# Patient Record
Sex: Female | Born: 1935 | Hispanic: No | Smoking: Never smoker
Health system: Southern US, Community
[De-identification: ages and names within clinical notes are randomized; demographics above are authoritative.]

## PROBLEM LIST (undated history)

## (undated) DIAGNOSIS — K469 Unspecified abdominal hernia without obstruction or gangrene: Secondary | ICD-10-CM

## (undated) DIAGNOSIS — C210 Malignant neoplasm of anus, unspecified: Secondary | ICD-10-CM

## (undated) DIAGNOSIS — I10 Essential (primary) hypertension: Secondary | ICD-10-CM

## (undated) DIAGNOSIS — K5792 Diverticulitis of intestine, part unspecified, without perforation or abscess without bleeding: Secondary | ICD-10-CM

## (undated) DIAGNOSIS — J45909 Unspecified asthma, uncomplicated: Secondary | ICD-10-CM

## (undated) HISTORY — PX: FRACTURE SURGERY: SHX138

## (undated) HISTORY — PX: ABDOMINAL HYSTERECTOMY: SHX81

## (undated) HISTORY — PX: HERNIA REPAIR: SHX51

---

## 2011-06-26 ENCOUNTER — Emergency Department (HOSPITAL_BASED_OUTPATIENT_CLINIC_OR_DEPARTMENT_OTHER)
Admission: EM | Admit: 2011-06-26 | Discharge: 2011-06-26 | Disposition: A | Discharge status: Home / Self Care | Payer: Medicare Other | Attending: Emergency Medicine | Admitting: Emergency Medicine

## 2011-06-26 ENCOUNTER — Emergency Department (HOSPITAL_BASED_OUTPATIENT_CLINIC_OR_DEPARTMENT_OTHER): Payer: Medicare Other

## 2011-06-26 ENCOUNTER — Encounter (HOSPITAL_BASED_OUTPATIENT_CLINIC_OR_DEPARTMENT_OTHER): Payer: Self-pay

## 2011-06-26 DIAGNOSIS — K529 Noninfective gastroenteritis and colitis, unspecified: Secondary | ICD-10-CM

## 2011-06-26 DIAGNOSIS — I10 Essential (primary) hypertension: Secondary | ICD-10-CM | POA: Insufficient documentation

## 2011-06-26 DIAGNOSIS — R10819 Abdominal tenderness, unspecified site: Secondary | ICD-10-CM | POA: Insufficient documentation

## 2011-06-26 DIAGNOSIS — R109 Unspecified abdominal pain: Secondary | ICD-10-CM | POA: Insufficient documentation

## 2011-06-26 DIAGNOSIS — K5289 Other specified noninfective gastroenteritis and colitis: Secondary | ICD-10-CM | POA: Insufficient documentation

## 2011-06-26 HISTORY — DX: Unspecified asthma, uncomplicated: J45.909

## 2011-06-26 HISTORY — DX: Malignant neoplasm of anus, unspecified: C21.0

## 2011-06-26 HISTORY — DX: Diverticulitis of intestine, part unspecified, without perforation or abscess without bleeding: K57.92

## 2011-06-26 HISTORY — DX: Essential (primary) hypertension: I10

## 2011-06-26 HISTORY — DX: Unspecified abdominal hernia without obstruction or gangrene: K46.9

## 2011-06-26 LAB — DIFFERENTIAL
Basophils Absolute: 0 10*3/uL (ref 0.0–0.1)
Basophils Relative: 0 % (ref 0–1)
Eosinophils Absolute: 0 10*3/uL (ref 0.0–0.7)
Monocytes Absolute: 0.7 10*3/uL (ref 0.1–1.0)
Monocytes Relative: 9 % (ref 3–12)
Neutrophils Relative %: 75 % (ref 43–77)

## 2011-06-26 LAB — URINALYSIS, ROUTINE W REFLEX MICROSCOPIC
Bilirubin Urine: NEGATIVE
Nitrite: NEGATIVE
Specific Gravity, Urine: 1.011 (ref 1.005–1.030)
Urobilinogen, UA: 0.2 mg/dL (ref 0.0–1.0)
pH: 7.5 (ref 5.0–8.0)

## 2011-06-26 LAB — LIPASE, BLOOD: Lipase: 24 U/L (ref 11–59)

## 2011-06-26 LAB — URINE MICROSCOPIC-ADD ON

## 2011-06-26 LAB — COMPREHENSIVE METABOLIC PANEL
AST: 23 U/L (ref 0–37)
Albumin: 3.3 g/dL — ABNORMAL LOW (ref 3.5–5.2)
BUN: 17 mg/dL (ref 6–23)
Calcium: 9.9 mg/dL (ref 8.4–10.5)
Creatinine, Ser: 0.7 mg/dL (ref 0.50–1.10)
Total Protein: 6.8 g/dL (ref 6.0–8.3)

## 2011-06-26 LAB — CBC
HCT: 35.3 % — ABNORMAL LOW (ref 36.0–46.0)
Hemoglobin: 12.1 g/dL (ref 12.0–15.0)
MCH: 33.1 pg (ref 26.0–34.0)
MCHC: 34.3 g/dL (ref 30.0–36.0)
RDW: 12.6 % (ref 11.5–15.5)

## 2011-06-26 MED ORDER — HYDROCODONE-ACETAMINOPHEN 5-500 MG PO TABS: 1.0000 | ORAL_TABLET | Freq: Four times a day (QID) | ORAL | Status: DC | PRN

## 2011-06-26 MED ORDER — CIPROFLOXACIN HCL 500 MG PO TABS: 500.0000 mg | ORAL_TABLET | Freq: Two times a day (BID) | ORAL | Status: AC

## 2011-06-26 MED ORDER — ONDANSETRON HCL 4 MG PO TABS: 4.0000 mg | ORAL_TABLET | Freq: Four times a day (QID) | ORAL | Status: DC

## 2011-06-26 MED ORDER — CIPROFLOXACIN HCL 500 MG PO TABS: 500.0000 mg | ORAL_TABLET | Freq: Two times a day (BID) | ORAL | Status: DC

## 2011-06-26 MED ORDER — SODIUM CHLORIDE 0.9 % IV BOLUS (SEPSIS)
500.0000 mL | Freq: Once | INTRAVENOUS | Status: AC
  Administered 2011-06-26: 500 mL via INTRAVENOUS

## 2011-06-26 MED ORDER — IOHEXOL 300 MG/ML  SOLN
18.0000 mL | INTRAMUSCULAR | Status: AC
  Administered 2011-06-26: 18 mL via ORAL

## 2011-06-26 MED ORDER — ONDANSETRON HCL 4 MG/2ML IJ SOLN
4.0000 mg | Freq: Once | INTRAMUSCULAR | Status: AC
  Administered 2011-06-26: 4 mg via INTRAVENOUS
  Filled 2011-06-26: qty 2

## 2011-06-26 MED ORDER — METRONIDAZOLE 500 MG PO TABS
500.0000 mg | ORAL_TABLET | Freq: Once | ORAL | Status: AC
  Administered 2011-06-26: 500 mg via ORAL
  Filled 2011-06-26: qty 1

## 2011-06-26 MED ORDER — MORPHINE SULFATE 2 MG/ML IJ SOLN
2.0000 mg | Freq: Once | INTRAMUSCULAR | Status: AC
  Administered 2011-06-26: 2 mg via INTRAVENOUS
  Filled 2011-06-26: qty 1

## 2011-06-26 MED ORDER — HYDROCODONE-ACETAMINOPHEN 5-500 MG PO TABS: 1.0000 | ORAL_TABLET | Freq: Four times a day (QID) | ORAL | Status: AC | PRN

## 2011-06-26 MED ORDER — CIPROFLOXACIN HCL 500 MG PO TABS
500.0000 mg | ORAL_TABLET | Freq: Once | ORAL | Status: AC
Start: 2011-06-26 — End: 2011-06-26
  Administered 2011-06-26: 500 mg via ORAL
  Filled 2011-06-26: qty 1

## 2011-06-26 MED ORDER — ONDANSETRON HCL 4 MG PO TABS: 4.0000 mg | ORAL_TABLET | Freq: Four times a day (QID) | ORAL | Status: AC

## 2011-06-26 MED ORDER — IOHEXOL 300 MG/ML  SOLN
100.0000 mL | Freq: Once | INTRAMUSCULAR | Status: AC | PRN
  Administered 2011-06-26: 100 mL via INTRAVENOUS

## 2011-06-26 MED ORDER — METRONIDAZOLE 500 MG PO TABS: 500.0000 mg | ORAL_TABLET | Freq: Two times a day (BID) | ORAL | Status: DC

## 2011-06-26 MED ORDER — METRONIDAZOLE 500 MG PO TABS: 500.0000 mg | ORAL_TABLET | Freq: Two times a day (BID) | ORAL | Status: AC

## 2011-06-26 NOTE — ED Notes (Signed)
Pt has abdominal pain, vomiting and diarrhea sin 0200am.

## 2011-06-26 NOTE — ED Provider Notes (Signed)
History   This chart was scribed for Stacey Bucco, MD by [name]. The patient was seen in room MH02/MH02 and the patient's care was started at 4:07 PM     CSN: [id number]  Arrival date & time 06/26/11  1512   First MD Initiated Contact with Patient 06/26/11 1604      Chief Complaint  Patient presents with  . Abdominal Pain  . Emesis  . Diarrhea    (Consider location/radiation/quality/duration/timing/severity/associated sxs/prior treatment) HPI  Stacey Erickson is a 78 y.o. female who presents to the Emergency Department complaining of moderate, episodic abdominal pain located at the left mid abdomen onset today with associated symptoms of vomiting, diarrhea. The pt relative states "the pain comes and goes." The pt relative informs the EDP that the vomit has been "yellow and bitter." Pt relative states "we all ate soup last night." Pt has a hx of hernia repair, abdominal hysterectomy.  Has hx of diverticulitis, says that this feels similar.  Has taken pepto bismol without relief.  Symptoms started last night  Pt denies removal of gallbladder and appendix, fever, hematochezia, melena, rash, difficulty urinating,   Pt visits with Physicians in [place], [place].    Past Medical History  Diagnosis Date  . Asthma   . Hypertension   . Anal cancer   . Diverticulitis   . Hernia     Past Surgical History  Procedure Date  . Hernia repair   . Fracture surgery   . Abdominal hysterectomy       History  Substance Use Topics  . Smoking status: Never Smoker   . Smokeless tobacco: Never Used  . Alcohol Use: No    OB History    Grav Para Term Preterm Abortions TAB SAB Ect Mult Living                  Review of Systems  Constitutional: Negative for fever, chills, diaphoresis and fatigue.  HENT: Negative for congestion, rhinorrhea and sneezing.   Eyes: Negative.   Respiratory: Negative for cough, chest tightness and shortness of breath.   Cardiovascular: Negative for chest pain  and leg swelling.  Gastrointestinal: Positive for nausea, vomiting, abdominal pain and diarrhea. Negative for blood in stool.  Genitourinary: Negative for frequency, hematuria, flank pain and difficulty urinating.  Musculoskeletal: Negative for back pain and arthralgias.  Skin: Negative for rash.  Neurological: Negative for dizziness, speech difficulty, weakness, numbness and headaches.    Allergies  Review of patient's allergies indicates no known allergies.  Home Medications   Current Outpatient Rx  Name Route Sig Dispense Refill  . AMLODIPINE BESYLATE 10 MG PO TABS Oral Take 10 mg by mouth daily.    Marland Kitchen BIOTIN 10 MG PO CAPS Oral Take 1 tablet by mouth daily.     Marland Kitchen BISMUTH SUBSALICYLATE 262 MG PO CHEW Oral Chew 262 mg by mouth as needed. Patient used this medication for stomach pain.    Marland Kitchen CALCIUM CARBONATE 1250 MG PO CHEW Oral Chew 1 tablet by mouth daily. Patient used this medication for stomach pain as well.    Marland Kitchen VITAMIN D 1000 UNITS PO TABS Oral Take 1,000 Units by mouth daily.    . MULTIVITAMINS PO CAPS Oral Take 1 capsule by mouth daily.    Marland Kitchen OMEPRAZOLE 20 MG PO CPDR Oral Take 20 mg by mouth daily. Patient is using this medication for acid reflux.    Marland Kitchen PRAVASTATIN SODIUM 10 MG PO TABS Oral Take 10 mg by mouth daily.    Marland Kitchen  CIPROFLOXACIN HCL 500 MG PO TABS Oral Take 1 tablet (500 mg total) by mouth every 12 (twelve) hours. 20 tablet 0  . METRONIDAZOLE 500 MG PO TABS Oral Take 1 tablet (500 mg total) by mouth 2 (two) times daily. 14 tablet 0  . ONDANSETRON HCL 4 MG PO TABS Oral Take 1 tablet (4 mg total) by mouth every 6 (six) hours. 12 tablet 0    BP 139/64  Pulse 85  Temp 98.1 F (36.7 C) (Oral)  Resp 16  Ht 5\' 2"  (1.575 m)  Wt 137 lb (62.143 kg)  BMI 25.06 kg/m2  SpO2 100%  Physical Exam  Nursing note and vitals reviewed. Constitutional: She is oriented to person, place, and time. She appears well-developed and well-nourished.  HENT:  Head: Normocephalic and atraumatic.    Eyes: Pupils are equal, round, and reactive to light.  Neck: Normal range of motion. Neck supple.  Cardiovascular: Normal rate, regular rhythm and normal heart sounds.   Pulmonary/Chest: Effort normal and breath sounds normal. No respiratory distress. She has no wheezes. She has no rales. She exhibits no tenderness.  Abdominal: Soft. Bowel sounds are normal. There is tenderness (moderate, left mid abdomen. ). There is no rebound and no guarding.  Musculoskeletal: Normal range of motion. She exhibits no edema.  Lymphadenopathy:    She has no cervical adenopathy.  Neurological: She is alert and oriented to person, place, and time.  Skin: Skin is warm and dry. No rash noted.  Psychiatric: She has a normal mood and affect.    ED Course  Procedures (including critical care time)  DIAGNOSTIC STUDIES: Oxygen Saturation is 100% on room air, normal by my interpretation.    COORDINATION OF CARE:   4:11PM- EDP at bedside discusses treatment plan concerning IV fluids and nausea management, CT scan, and blood work.   Results for orders placed during the hospital encounter of 06/26/11  URINALYSIS, ROUTINE W REFLEX MICROSCOPIC      Component Value Range   Color, Urine YELLOW  YELLOW   APPearance CLEAR  CLEAR   Specific Gravity, Urine 1.011  1.005 - 1.030   pH 7.5  5.0 - 8.0   Glucose, UA NEGATIVE  NEGATIVE mg/dL   Hgb urine dipstick NEGATIVE  NEGATIVE   Bilirubin Urine NEGATIVE  NEGATIVE   Ketones, ur NEGATIVE  NEGATIVE mg/dL   Protein, ur NEGATIVE  NEGATIVE mg/dL   Urobilinogen, UA 0.2  0.0 - 1.0 mg/dL   Nitrite NEGATIVE  NEGATIVE   Leukocytes, UA TRACE (*) NEGATIVE  URINE MICROSCOPIC-ADD ON      Component Value Range   Squamous Epithelial / LPF RARE  RARE   WBC, UA 0-2  <3 WBC/hpf   Bacteria, UA RARE  RARE  CBC      Component Value Range   WBC 7.4  4.0 - 10.5 K/uL   RBC 3.66 (*) 3.87 - 5.11 MIL/uL   Hemoglobin 12.1  12.0 - 15.0 g/dL   HCT 16.1 (*) 09.6 - 04.5 %   MCV 96.4  78.0  - 100.0 fL   MCH 33.1  26.0 - 34.0 pg   MCHC 34.3  30.0 - 36.0 g/dL   RDW 40.9  81.1 - 91.4 %   Platelets 151  150 - 400 K/uL  DIFFERENTIAL      Component Value Range   Neutrophils Relative 75  43 - 77 %   Neutro Abs 5.6  1.7 - 7.7 K/uL   Lymphocytes Relative 15  12 - 46 %  Lymphs Abs 1.1  0.7 - 4.0 K/uL   Monocytes Relative 9  3 - 12 %   Monocytes Absolute 0.7  0.1 - 1.0 K/uL   Eosinophils Relative 0  0 - 5 %   Eosinophils Absolute 0.0  0.0 - 0.7 K/uL   Basophils Relative 0  0 - 1 %   Basophils Absolute 0.0  0.0 - 0.1 K/uL  COMPREHENSIVE METABOLIC PANEL      Component Value Range   Sodium 140  135 - 145 mEq/L   Potassium 3.6  3.5 - 5.1 mEq/L   Chloride 104  96 - 112 mEq/L   CO2 28  19 - 32 mEq/L   Glucose, Bld 112 (*) 70 - 99 mg/dL   BUN 17  6 - 23 mg/dL   Creatinine, Ser 6.96  0.50 - 1.10 mg/dL   Calcium 9.9  8.4 - 29.5 mg/dL   Total Protein 6.8  6.0 - 8.3 g/dL   Albumin 3.3 (*) 3.5 - 5.2 g/dL   AST 23  0 - 37 U/L   ALT 13  0 - 35 U/L   Alkaline Phosphatase 100  39 - 117 U/L   Total Bilirubin 0.3  0.3 - 1.2 mg/dL   GFR calc non Af Amer 81 (*) >90 mL/min   GFR calc Af Amer >90  >90 mL/min  LIPASE, BLOOD      Component Value Range   Lipase 24  11 - 59 U/L   Ct Abdomen Pelvis W Contrast  06/26/2011  *RADIOLOGY REPORT*  Clinical Data: Left-sided abdominal pain  CT ABDOMEN AND PELVIS WITH CONTRAST  Technique:  Multidetector CT imaging of the abdomen and pelvis was performed following the standard protocol during bolus administration of intravenous contrast.  Contrast: 18mL OMNIPAQUE IOHEXOL 300 MG/ML  SOLN, OMNIPAQUE IOHEXOL 300 MG/ML  SOLN  Comparison: None.  Findings: A catheter tip is noted at the cavoatrial junction. Coronary artery and aortic valve calcification.  Mild linear opacities at the lung bases and a calcified right lower lobe granuloma.  Unremarkable liver, biliary system, spleen, adrenal glands.  There is heterogeneous attenuation about the head of the  pancreas, favored to reflect interdigitating fat.  No main duct dilatation.  2.8 cm right renal cyst.  Several right renal hypodensities are too small to further characterize.  No hydronephrosis or hydroureter.  Decompressed colon with colonic diverticulosis noted.  Ingested contrast is present within the cecum and distal small bowel. Mild small bowel dilatation with air-fluid level of a 3.3 cm. Transition point at a loop of proximal ileum where there is marked circumferential thickening and mucosal hyperenhancement.  There is mild associated vascular engorgement and interloop mesenteric fluid.  No free intraperitoneal air.  No pneumatosis.  No free intraperitoneal air.  No lymphadenopathy. There is scattered atherosclerotic calcification of the aorta and its branches. No aneurysmal dilatation.  Thin-walled bladder.  Absent uterus.  No adnexal mass.  Status post anterior abdominal wall mesh repair.  Osteopenia and multilevel degenerative changes.  No acute osseous finding.  IMPRESSION: Thickened segment of the proximal jejunal with associated perienteric fat stranding and fluid.  This is in keeping with a nonspecific enteritis with ischemic, infectious, and inflammatory considerations.  Proximal small bowel dilatation suggests delayed transit through this segment however contrast is noted within more distal small bowel, therefore no complete obstruction at this time.  There is heterogeneous attenuation about the head of the pancreas, favored to reflect interdigitating fat. Correlation with prior outside imaging recommended to  document stability.  If not available, consider a follow-up pancreas protocol CT or MRI.  Original Report Authenticated By: Waneta Martins, M.D.      Ct Abdomen Pelvis W Contrast  06/26/2011  *RADIOLOGY REPORT*  Clinical Data: Left-sided abdominal pain  CT ABDOMEN AND PELVIS WITH CONTRAST  Technique:  Multidetector CT imaging of the abdomen and pelvis was performed following the  standard protocol during bolus administration of intravenous contrast.  Contrast: 18mL OMNIPAQUE IOHEXOL 300 MG/ML  SOLN, OMNIPAQUE IOHEXOL 300 MG/ML  SOLN  Comparison: None.  Findings: A catheter tip is noted at the cavoatrial junction. Coronary artery and aortic valve calcification.  Mild linear opacities at the lung bases and a calcified right lower lobe granuloma.  Unremarkable liver, biliary system, spleen, adrenal glands.  There is heterogeneous attenuation about the head of the pancreas, favored to reflect interdigitating fat.  No main duct dilatation.  2.8 cm right renal cyst.  Several right renal hypodensities are too small to further characterize.  No hydronephrosis or hydroureter.  Decompressed colon with colonic diverticulosis noted.  Ingested contrast is present within the cecum and distal small bowel. Mild small bowel dilatation with air-fluid level of a 3.3 cm. Transition point at a loop of proximal ileum where there is marked circumferential thickening and mucosal hyperenhancement.  There is mild associated vascular engorgement and interloop mesenteric fluid.  No free intraperitoneal air.  No pneumatosis.  No free intraperitoneal air.  No lymphadenopathy. There is scattered atherosclerotic calcification of the aorta and its branches. No aneurysmal dilatation.  Thin-walled bladder.  Absent uterus.  No adnexal mass.  Status post anterior abdominal wall mesh repair.  Osteopenia and multilevel degenerative changes.  No acute osseous finding.  IMPRESSION: Thickened segment of the proximal jejunal with associated perienteric fat stranding and fluid.  This is in keeping with a nonspecific enteritis with ischemic, infectious, and inflammatory considerations.  Proximal small bowel dilatation suggests delayed transit through this segment however contrast is noted within more distal small bowel, therefore no complete obstruction at this time.  There is heterogeneous attenuation about the head of the  pancreas, favored to reflect interdigitating fat. Correlation with prior outside imaging recommended to document stability.  If not available, consider a follow-up pancreas protocol CT or MRI.  Original Report Authenticated By: Waneta Martins, M.D.     1. Enteritis       MDM  Pt with enteritis, has hx of similar symptoms with diverticulitis.  Will tx for this.  Pt's exam not consistent with ischemic colitis.  Only with minimal pain on abd exam.  Feeling better.  Advised pt and daughter that will need f/u with her PMD in [place] regarding the pancreas abnormality on CT      I personally performed the services described in this documentation, which was scribed in my presence.  The recorded information has been reviewed and considered.    Stacey Bucco, MD 06/26/11 [phone]

## 2011-06-26 NOTE — Discharge Instructions (Signed)
Colitis Colitis is inflammation of the colon. Colitis can be a short-term or long-standing (chronic) illness. Crohn's disease and ulcerative colitis are 2 types of colitis which are chronic. They usually require lifelong treatment. CAUSES  There are many different causes of colitis, including:  Viruses.   Germs (bacteria).   Medicine reactions.  SYMPTOMS   Diarrhea.   Intestinal bleeding.   Pain.   Fever.   Throwing up (vomiting).   Tiredness (fatigue).   Weight loss.   Bowel blockage.  DIAGNOSIS  The diagnosis of colitis is based on examination and stool or blood tests. X-rays, CT scan, and colonoscopy may also be needed. TREATMENT  Treatment may include:  Fluids given through the vein (intravenously).   Bowel rest (nothing to eat or drink for a period of time).   Medicine for pain and diarrhea.   Medicines (antibiotics) that kill germs.   Cortisone medicines.   Surgery.  HOME CARE INSTRUCTIONS   Get plenty of rest.   Drink enough water and fluids to keep your urine clear or pale yellow.   Eat a well-balanced diet.   Call your caregiver for follow-up as recommended.  SEEK IMMEDIATE MEDICAL CARE IF:   You develop chills.   You have an oral temperature above 102 F (38.9 C), not controlled by medicine.   You have extreme weakness, fainting, or dehydration.   You have repeated vomiting.   You develop severe belly (abdominal) pain or are passing bloody or tarry stools.  MAKE SURE YOU:   Understand these instructions.   Will watch your condition.   Will get help right away if you are not doing well or get worse.  Document Released: 02/03/2004 Document Revised: 12/15/2010 Document Reviewed: 04/30/2009 [employer] Patient Information 2012 [employer]. 

## 2014-04-13 ENCOUNTER — Other Ambulatory Visit: Payer: Self-pay

## 2014-04-13 ENCOUNTER — Encounter (HOSPITAL_BASED_OUTPATIENT_CLINIC_OR_DEPARTMENT_OTHER): Payer: Self-pay

## 2014-04-13 ENCOUNTER — Emergency Department (HOSPITAL_BASED_OUTPATIENT_CLINIC_OR_DEPARTMENT_OTHER)
Admission: EM | Admit: 2014-04-13 | Discharge: 2014-04-13 | Disposition: A | Discharge status: Home / Self Care | Payer: Medicare Other | Attending: Emergency Medicine | Admitting: Emergency Medicine

## 2014-04-13 DIAGNOSIS — N390 Urinary tract infection, site not specified: Secondary | ICD-10-CM | POA: Insufficient documentation

## 2014-04-13 DIAGNOSIS — S161XXA Strain of muscle, fascia and tendon at neck level, initial encounter: Secondary | ICD-10-CM | POA: Diagnosis not present

## 2014-04-13 DIAGNOSIS — X58XXXA Exposure to other specified factors, initial encounter: Secondary | ICD-10-CM | POA: Diagnosis not present

## 2014-04-13 DIAGNOSIS — Z79899 Other long term (current) drug therapy: Secondary | ICD-10-CM | POA: Diagnosis not present

## 2014-04-13 DIAGNOSIS — R630 Anorexia: Secondary | ICD-10-CM | POA: Insufficient documentation

## 2014-04-13 DIAGNOSIS — S4992XA Unspecified injury of left shoulder and upper arm, initial encounter: Secondary | ICD-10-CM | POA: Insufficient documentation

## 2014-04-13 DIAGNOSIS — Y9389 Activity, other specified: Secondary | ICD-10-CM | POA: Insufficient documentation

## 2014-04-13 DIAGNOSIS — J45909 Unspecified asthma, uncomplicated: Secondary | ICD-10-CM | POA: Insufficient documentation

## 2014-04-13 DIAGNOSIS — Z85048 Personal history of other malignant neoplasm of rectum, rectosigmoid junction, and anus: Secondary | ICD-10-CM | POA: Insufficient documentation

## 2014-04-13 DIAGNOSIS — Y9289 Other specified places as the place of occurrence of the external cause: Secondary | ICD-10-CM | POA: Insufficient documentation

## 2014-04-13 DIAGNOSIS — Z8719 Personal history of other diseases of the digestive system: Secondary | ICD-10-CM | POA: Insufficient documentation

## 2014-04-13 DIAGNOSIS — Y998 Other external cause status: Secondary | ICD-10-CM | POA: Insufficient documentation

## 2014-04-13 DIAGNOSIS — I10 Essential (primary) hypertension: Secondary | ICD-10-CM | POA: Diagnosis not present

## 2014-04-13 DIAGNOSIS — R531 Weakness: Secondary | ICD-10-CM | POA: Diagnosis present

## 2014-04-13 LAB — COMPREHENSIVE METABOLIC PANEL
ALT: 32 U/L (ref 0–35)
ANION GAP: 9 (ref 5–15)
AST: 38 U/L — ABNORMAL HIGH (ref 0–37)
Albumin: 3.5 g/dL (ref 3.5–5.2)
Alkaline Phosphatase: 140 U/L — ABNORMAL HIGH (ref 39–117)
BUN: 25 mg/dL — AB (ref 6–23)
CALCIUM: 9.2 mg/dL (ref 8.4–10.5)
CO2: 27 mmol/L (ref 19–32)
CREATININE: 0.88 mg/dL (ref 0.50–1.10)
Chloride: 98 mmol/L (ref 96–112)
GFR calc Af Amer: 70 mL/min — ABNORMAL LOW (ref >90)
GFR, EST NON AFRICAN AMERICAN: 60 mL/min — AB (ref >90)
GLUCOSE: 117 mg/dL — AB (ref 70–99)
Potassium: 3.7 mmol/L (ref 3.5–5.1)
SODIUM: 134 mmol/L — AB (ref 135–145)
Total Bilirubin: 0.2 mg/dL — ABNORMAL LOW (ref 0.3–1.2)
Total Protein: 7.9 g/dL (ref 6.0–8.3)

## 2014-04-13 LAB — CBC WITH DIFFERENTIAL/PLATELET
BASOS ABS: 0 10*3/uL (ref 0.0–0.1)
BASOS PCT: 0 % (ref 0–1)
Eosinophils Absolute: 0 10*3/uL (ref 0.0–0.7)
Eosinophils Relative: 0 % (ref 0–5)
HEMATOCRIT: 38.6 % (ref 36.0–46.0)
HEMOGLOBIN: 12.3 g/dL (ref 12.0–15.0)
LYMPHS PCT: 13 % (ref 12–46)
Lymphs Abs: 0.9 10*3/uL (ref 0.7–4.0)
MCH: 30.4 pg (ref 26.0–34.0)
MCHC: 31.9 g/dL (ref 30.0–36.0)
MCV: 95.3 fL (ref 78.0–100.0)
MONO ABS: 0.9 10*3/uL (ref 0.1–1.0)
Monocytes Relative: 13 % — ABNORMAL HIGH (ref 3–12)
NEUTROS ABS: 5.6 10*3/uL (ref 1.7–7.7)
NEUTROS PCT: 74 % (ref 43–77)
Platelets: 166 10*3/uL (ref 150–400)
RBC: 4.05 MIL/uL (ref 3.87–5.11)
RDW: 13 % (ref 11.5–15.5)
WBC: 7.4 10*3/uL (ref 4.0–10.5)

## 2014-04-13 LAB — URINALYSIS, ROUTINE W REFLEX MICROSCOPIC
BILIRUBIN URINE: NEGATIVE
GLUCOSE, UA: NEGATIVE mg/dL
Ketones, ur: NEGATIVE mg/dL
NITRITE: NEGATIVE
PH: 6.5 (ref 5.0–8.0)
Protein, ur: 30 mg/dL — AB
SPECIFIC GRAVITY, URINE: 1.013 (ref 1.005–1.030)
Urobilinogen, UA: 1 mg/dL (ref 0.0–1.0)

## 2014-04-13 LAB — TROPONIN I: Troponin I: 0.03 ng/mL (ref <0.031)

## 2014-04-13 LAB — URINE MICROSCOPIC-ADD ON

## 2014-04-13 MED ORDER — DEXTROSE 5 % IV SOLN
1.0000 g | Freq: Once | INTRAVENOUS | Status: AC
  Administered 2014-04-13: 1 g via INTRAVENOUS

## 2014-04-13 MED ORDER — CEFTRIAXONE SODIUM 1 G IJ SOLR
INTRAMUSCULAR | Status: AC
  Filled 2014-04-13: qty 10

## 2014-04-13 MED ORDER — SODIUM CHLORIDE 0.9 % IV BOLUS (SEPSIS)
500.0000 mL | Freq: Once | INTRAVENOUS | Status: AC
  Administered 2014-04-13: 500 mL via INTRAVENOUS

## 2014-04-13 MED ORDER — ONDANSETRON HCL 4 MG/2ML IJ SOLN
4.0000 mg | Freq: Once | INTRAMUSCULAR | Status: AC
  Administered 2014-04-13: 4 mg via INTRAVENOUS
  Filled 2014-04-13: qty 2

## 2014-04-13 MED ORDER — CEPHALEXIN 500 MG PO CAPS: 500.0000 mg | ORAL_CAPSULE | Freq: Two times a day (BID) | ORAL | Status: DC

## 2014-04-13 NOTE — ED Notes (Signed)
D/c home with her son- Rx x 1 given for keflex- pt speaks some english, son interpreting

## 2014-04-13 NOTE — Discharge Instructions (Signed)
Cervical Sprain °A cervical sprain is an injury in the neck in which the strong, fibrous tissues (ligaments) that connect your neck bones stretch or tear. Cervical sprains can range from mild to severe. Severe cervical sprains can cause the neck vertebrae to be unstable. This can lead to damage of the spinal cord and can result in serious nervous system problems. The amount of time it takes for a cervical sprain to get better depends on the cause and extent of the injury. Most cervical sprains heal in 1 to 3 weeks. °CAUSES  °Severe cervical sprains may be caused by:  °· Contact sport injuries (such as from football, rugby, wrestling, hockey, auto racing, gymnastics, diving, martial arts, or boxing).   °· Motor vehicle collisions.   °· Whiplash injuries. This is an injury from a sudden forward and backward whipping movement of the head and neck.  °· Falls.   °Mild cervical sprains may be caused by:  °· Being in an awkward position, such as while cradling a telephone between your ear and shoulder.   °· Sitting in a chair that does not offer proper support.   °· Working at a poorly designed computer station.   °· Looking up or down for long periods of time.   °SYMPTOMS  °· Pain, soreness, stiffness, or a burning sensation in the front, back, or sides of the neck. This discomfort may develop immediately after the injury or slowly, 24 hours or more after the injury.   °· Pain or tenderness directly in the middle of the back of the neck.   °· Shoulder or upper back pain.   °· Limited ability to move the neck.   °· Headache.   °· Dizziness.   °· Weakness, numbness, or tingling in the hands or arms.   °· Muscle spasms.   °· Difficulty swallowing or chewing.   °· Tenderness and swelling of the neck.   °DIAGNOSIS  °Most of the time your health care provider can diagnose a cervical sprain by taking your history and doing a physical exam. Your health care provider will ask about previous neck injuries and any known neck  problems, such as arthritis in the neck. X-rays may be taken to find out if there are any other problems, such as with the bones of the neck. Other tests, such as a CT scan or MRI, may also be needed.  °TREATMENT  °Treatment depends on the severity of the cervical sprain. Mild sprains can be treated with rest, keeping the neck in place (immobilization), and pain medicines. Severe cervical sprains are immediately immobilized. Further treatment is done to help with pain, muscle spasms, and other symptoms and may include: °· Medicines, such as pain relievers, numbing medicines, or muscle relaxants.   °· Physical therapy. This may involve stretching exercises, strengthening exercises, and posture training. Exercises and improved posture can help stabilize the neck, strengthen muscles, and help stop symptoms from returning.   °HOME CARE INSTRUCTIONS  °· Put ice on the injured area.   °¨ Put ice in a plastic bag.   °¨ Place a towel between your skin and the bag.   °¨ Leave the ice on for 15-20 minutes, 3-4 times a day.   °· If your injury was severe, you may have been given a cervical collar to wear. A cervical collar is a two-piece collar designed to keep your neck from moving while it heals. °¨ Do not remove the collar unless instructed by your health care provider. °¨ If you have long hair, keep it outside of the collar. °¨ Ask your health care provider before making any adjustments to your collar. Minor   adjustments may be required over time to improve comfort and reduce pressure on your chin or on the back of your head.  Ifyou are allowed to remove the collar for cleaning or bathing, follow your health care provider's instructions on how to do so safely.  Keep your collar clean by wiping it with mild soap and water and drying it completely. If the collar you have been given includes removable pads, remove them every 1-2 days and hand wash them with soap and water. Allow them to air dry. They should be completely  dry before you wear them in the collar.  If you are allowed to remove the collar for cleaning and bathing, wash and dry the skin of your neck. Check your skin for irritation or sores. If you see any, tell your health care provider.  Do not drive while wearing the collar.   Only take over-the-counter or prescription medicines for pain, discomfort, or fever as directed by your health care provider.   Keep all follow-up appointments as directed by your health care provider.   Keep all physical therapy appointments as directed by your health care provider.   Make any needed adjustments to your workstation to promote good posture.   Avoid positions and activities that make your symptoms worse.   Warm up and stretch before being active to help prevent problems.  SEEK MEDICAL CARE IF:   Your pain is not controlled with medicine.   You are unable to decrease your pain medicine over time as planned.   Your activity level is not improving as expected.  SEEK IMMEDIATE MEDICAL CARE IF:   You develop any bleeding.  You develop stomach upset.  You have signs of an allergic reaction to your medicine.   Your symptoms get worse.   You develop new, unexplained symptoms.   You have numbness, tingling, weakness, or paralysis in any part of your body.  MAKE SURE YOU:   Understand these instructions.  Will watch your condition.  Will get help right away if you are not doing well or get worse. Document Released: 10/23/2006 Document Revised: 12/31/2012 Document Reviewed: 07/03/2012 [facility] Patient Information 2015 [place], [place]. This information is not intended to replace advice given to you by your health care provider. Make sure you discuss any questions you have with your health care provider.  Urinary Tract Infection Urinary tract infections (UTIs) can develop anywhere along your urinary tract. Your urinary tract is your body's drainage system for removing wastes and  extra water. Your urinary tract includes two kidneys, two ureters, a bladder, and a urethra. Your kidneys are a pair of bean-shaped organs. Each kidney is about the size of your fist. They are located below your ribs, one on each side of your spine. CAUSES Infections are caused by microbes, which are microscopic organisms, including fungi, viruses, and bacteria. These organisms are so small that they can only be seen through a microscope. Bacteria are the microbes that most commonly cause UTIs. SYMPTOMS  Symptoms of UTIs may vary by age and gender of the patient and by the location of the infection. Symptoms in young women typically include a frequent and intense urge to urinate and a painful, burning feeling in the bladder or urethra during urination. Older women and men are more likely to be tired, shaky, and weak and have muscle aches and abdominal pain. A fever may mean the infection is in your kidneys. Other symptoms of a kidney infection include pain in your back or sides  below the ribs, nausea, and vomiting. DIAGNOSIS To diagnose a UTI, your caregiver will ask you about your symptoms. Your caregiver also will ask to provide a urine sample. The urine sample will be tested for bacteria and white blood cells. White blood cells are made by your body to help fight infection. TREATMENT  Typically, UTIs can be treated with medication. Because most UTIs are caused by a bacterial infection, they usually can be treated with the use of antibiotics. The choice of antibiotic and length of treatment depend on your symptoms and the type of bacteria causing your infection. HOME CARE INSTRUCTIONS  If you were prescribed antibiotics, take them exactly as your caregiver instructs you. Finish the medication even if you feel better after you have only taken some of the medication.  Drink enough water and fluids to keep your urine clear or pale yellow.  Avoid caffeine, tea, and carbonated beverages. They tend to  irritate your bladder.  Empty your bladder often. Avoid holding urine for long periods of time.  Empty your bladder before and after sexual intercourse.  After a bowel movement, women should cleanse from front to back. Use each tissue only once. SEEK MEDICAL CARE IF:   You have back pain.  You develop a fever.  Your symptoms do not begin to resolve within 3 days. SEEK IMMEDIATE MEDICAL CARE IF:   You have severe back pain or lower abdominal pain.  You develop chills.  You have nausea or vomiting.  You have continued burning or discomfort with urination. MAKE SURE YOU:   Understand these instructions.  Will watch your condition.  Will get help right away if you are not doing well or get worse. Document Released: 10/05/2004 Document Revised: 06/27/2011 Document Reviewed: 02/03/2011 [facility] Patient Information 2015 [place], [place]. This information is not intended to replace advice given to you by your health care provider. Make sure you discuss any questions you have with your health care provider.

## 2014-04-13 NOTE — ED Provider Notes (Signed)
CSN: [id number]     Arrival date & time 04/13/14  1512 History  This chart was scribed for Malvin Johns, MD by Rayfield Citizen, ED Scribe. This patient was seen in room MH12/MH12 and the patient's care was started at 3:41 PM.    Chief Complaint  Patient presents with  . Weakness   Patient is a 81 y.o. female presenting with weakness. The history is provided by the patient and a relative. A language interpreter was used.  Weakness Pertinent negatives include no chest pain, no abdominal pain and no shortness of breath.     HPI Comments: Stacey Erickson is a 81 y.o. female with past medical history of asthma, HTN, anal cancer, diverticulitis, hernia who presents to the Emergency Department complaining of 1 week of dysuria and lower back pain (L>R), and 3 days of fever, chills, vomiting (episodes 3 days PTA, no further episodes). She also notes weakness and appetite decrease. She denies abdominal pain, diarrhea, chest pain, SOB, cough or cold symptoms. She denies prior experience with urinary tract infections.   Patient also complains of 2-3 months of gradually worsening pain in her left arm, from left-sided neck to left shoulder to left wrist; her pain is exacerbated with movement and certain positions (seated, when lying down to sleep at night, "utilizing my arm to sit or stand up"). She denies any numbness in her left hand.  Patient has a PCP at this time; she amenable to further evaluation of her arm pain in his or her office.   Past Medical History  Diagnosis Date  . Asthma   . Hypertension   . Anal cancer   . Diverticulitis   . Hernia    Past Surgical History  Procedure Laterality Date  . Hernia repair    . Fracture surgery    . Abdominal hysterectomy     No family history on file. History  Substance Use Topics  . Smoking status: Never Smoker   . Smokeless tobacco: Never Used  . Alcohol Use: No   OB History    No data available     Review of Systems  Constitutional: Positive  for fever, chills and appetite change.  Respiratory: Negative for cough and shortness of breath.   Cardiovascular: Negative for chest pain.  Gastrointestinal: Positive for vomiting. Negative for abdominal pain and diarrhea.  Musculoskeletal: Positive for neck pain.  Neurological: Positive for weakness. Negative for numbness.  All other systems reviewed and are negative.   Allergies  Review of patient's allergies indicates no known allergies.  Home Medications   Prior to Admission medications   Medication Sig Start Date End Date Taking? Authorizing Provider  amLODipine (NORVASC) 10 MG tablet Take 10 mg by mouth daily.    Historical Provider, MD  Biotin 10 MG CAPS Take 1 tablet by mouth daily.     Historical Provider, MD  bismuth subsalicylate (PEPTO BISMOL) 262 MG chewable tablet Chew 262 mg by mouth as needed. Patient used this medication for stomach pain.    Historical Provider, MD  calcium carbonate (OS-CAL) 1250 MG chewable tablet Chew 1 tablet by mouth daily. Patient used this medication for stomach pain as well.    Historical Provider, MD  cephALEXin (KEFLEX) 500 MG capsule Take 1 capsule (500 mg total) by mouth 2 (two) times daily. 04/13/14   Malvin Johns, MD  cholecalciferol (VITAMIN D) 1000 UNITS tablet Take 1,000 Units by mouth daily.    Historical Provider, MD  Multiple Vitamin (MULTIVITAMIN) capsule Take 1 capsule by  mouth daily.    Historical Provider, MD  omeprazole (PRILOSEC) 20 MG capsule Take 20 mg by mouth daily. Patient is using this medication for acid reflux.    Historical Provider, MD  pravastatin (PRAVACHOL) 10 MG tablet Take 10 mg by mouth daily.    Historical Provider, MD   BP 121/46 mmHg  Pulse 88  Temp(Src) 98.9 F (37.2 C) (Oral)  Resp 16  Ht 5' (1.524 m)  Wt 141 lb (63.957 kg)  BMI 27.54 kg/m2  SpO2 100% Physical Exam  Constitutional: She is oriented to person, place, and time. She appears well-developed and well-nourished.  HENT:  Head: Normocephalic  and atraumatic.  Eyes: Pupils are equal, round, and reactive to light.  Neck: Normal range of motion. Neck supple.  Cardiovascular: Normal rate, regular rhythm and normal heart sounds.   Pulmonary/Chest: Effort normal and breath sounds normal. No respiratory distress. She has no wheezes. She has no rales. She exhibits no tenderness.  Abdominal: Soft. Bowel sounds are normal. There is tenderness (Mild left flank tenderness). There is no rebound and no guarding.  Musculoskeletal: Normal range of motion. She exhibits tenderness. She exhibits no edema.  Tenderness to the left trapezius and paraspinal area; no bony tenderness to the shoulder.   Lymphadenopathy:    She has no cervical adenopathy.  Neurological: She is alert and oriented to person, place, and time.  Normals sensation and motor function in the left arm. Radial pulse intact.   Skin: Skin is warm and dry. No rash noted.  Psychiatric: She has a normal mood and affect.    ED Course  Procedures   DIAGNOSTIC STUDIES: Oxygen Saturation is 97% on RA, adequate by my interpretation.    COORDINATION OF CARE: 3:56 PM Discussed treatment plan with pt at bedside and pt agreed to plan.  Results for orders placed or performed during the hospital encounter of 04/13/14  Urinalysis, Routine w reflex microscopic  Result Value Ref Range   Color, Urine YELLOW YELLOW   APPearance CLOUDY (A) CLEAR   Specific Gravity, Urine 1.013 1.005 - 1.030   pH 6.5 5.0 - 8.0   Glucose, UA NEGATIVE NEGATIVE mg/dL   Hgb urine dipstick SMALL (A) NEGATIVE   Bilirubin Urine NEGATIVE NEGATIVE   Ketones, ur NEGATIVE NEGATIVE mg/dL   Protein, ur 30 (A) NEGATIVE mg/dL   Urobilinogen, UA 1.0 0.0 - 1.0 mg/dL   Nitrite NEGATIVE NEGATIVE   Leukocytes, UA MODERATE (A) NEGATIVE  CBC with Differential  Result Value Ref Range   WBC 7.4 4.0 - 10.5 K/uL   RBC 4.05 3.87 - 5.11 MIL/uL   Hemoglobin 12.3 12.0 - 15.0 g/dL   HCT 38.6 36.0 - 46.0 %   MCV 95.3 78.0 - 100.0 fL    MCH 30.4 26.0 - 34.0 pg   MCHC 31.9 30.0 - 36.0 g/dL   RDW 13.0 11.5 - 15.5 %   Platelets 166 150 - 400 K/uL   Neutrophils Relative % 74 43 - 77 %   Neutro Abs 5.6 1.7 - 7.7 K/uL   Lymphocytes Relative 13 12 - 46 %   Lymphs Abs 0.9 0.7 - 4.0 K/uL   Monocytes Relative 13 (H) 3 - 12 %   Monocytes Absolute 0.9 0.1 - 1.0 K/uL   Eosinophils Relative 0 0 - 5 %   Eosinophils Absolute 0.0 0.0 - 0.7 K/uL   Basophils Relative 0 0 - 1 %   Basophils Absolute 0.0 0.0 - 0.1 K/uL  Comprehensive metabolic panel  Result  Value Ref Range   Sodium 134 (L) 135 - 145 mmol/L   Potassium 3.7 3.5 - 5.1 mmol/L   Chloride 98 96 - 112 mmol/L   CO2 27 19 - 32 mmol/L   Glucose, Bld 117 (H) 70 - 99 mg/dL   BUN 25 (H) 6 - 23 mg/dL   Creatinine, Ser 0.88 0.50 - 1.10 mg/dL   Calcium 9.2 8.4 - 10.5 mg/dL   Total Protein 7.9 6.0 - 8.3 g/dL   Albumin 3.5 3.5 - 5.2 g/dL   AST 38 (H) 0 - 37 U/L   ALT 32 0 - 35 U/L   Alkaline Phosphatase 140 (H) 39 - 117 U/L   Total Bilirubin 0.2 (L) 0.3 - 1.2 mg/dL   GFR calc non Af Amer 60 (L) >90 mL/min   GFR calc Af Amer 70 (L) >90 mL/min   Anion gap 9 5 - 15  Troponin I  Result Value Ref Range   Troponin I 0.03 <0.031 ng/mL  Urine microscopic-add on  Result Value Ref Range   Squamous Epithelial / LPF FEW (A) RARE   WBC, UA 11-20 <3 WBC/hpf   RBC / HPF 3-6 <3 RBC/hpf   Bacteria, UA FEW (A) RARE   No results found.  Labs Review Labs Reviewed  URINALYSIS, ROUTINE W REFLEX MICROSCOPIC - Abnormal; Notable for the following:    APPearance CLOUDY (*)    Hgb urine dipstick SMALL (*)    Protein, ur 30 (*)    Leukocytes, UA MODERATE (*)    All other components within normal limits  CBC WITH DIFFERENTIAL/PLATELET - Abnormal; Notable for the following:    Monocytes Relative 13 (*)    All other components within normal limits  COMPREHENSIVE METABOLIC PANEL - Abnormal; Notable for the following:    Sodium 134 (*)    Glucose, Bld 117 (*)    BUN 25 (*)    AST 38 (*)     Alkaline Phosphatase 140 (*)    Total Bilirubin 0.2 (*)    GFR calc non Af Amer 60 (*)    GFR calc Af Amer 70 (*)    All other components within normal limits  URINE MICROSCOPIC-ADD ON - Abnormal; Notable for the following:    Squamous Epithelial / LPF FEW (*)    Bacteria, UA FEW (*)    All other components within normal limits  URINE CULTURE  TROPONIN I   Imaging Review No results found.   EKG Interpretation   Date/Time:  Monday April 13 2014 15:29:21 EDT Ventricular Rate:  91 PR Interval:  150 QRS Duration: 68 QT Interval:  332 QTC Calculation: 408 R Axis:   62 Text Interpretation:  Normal sinus rhythm Nonspecific ST and T wave  abnormality Abnormal ECG No old tracing to compare Confirmed by Orange Hilligoss  MD,  Akiera Allbaugh ([id number]) on 04/13/2014 4:14:56 PM      MDM   Final diagnoses:  UTI (lower urinary tract infection)  Neck strain, initial encounter  patient presents with 2 complaints. Her first complaint seems to be a radicular type cervical pain. She's neurologically intact. She has no recent traumatic injuries. There is no bony tenderness to the cervical spine. She also presents with subjective fevers and urinary symptoms. She has positive evidence of a urinary tract infection. She otherwise is well appearing in the ED. She has no tachycardia. No vomiting. She's afebrile here. I feel that she could be treated as an outpatient. She was given an IV dose of Rocephin. Her  urine was sent for culture. She was given strict return precautions and encouraged to have close follow-up with her primary care physician who is in [place].  She was given a rx for Keflex.  I personally performed the services described in this documentation, which was scribed in my presence.  The recorded information has been reviewed and considered.      Malvin Johns, MD 04/13/14 [phone]

## 2014-04-13 NOTE — ED Notes (Signed)
MD at bedside. 

## 2014-04-13 NOTE — ED Notes (Addendum)
C/o weakness, HA, left arm pain x 1 week-fever,vomiting 3 days ago-pt speaks minimal English-son interpreting

## 2014-04-17 LAB — URINE CULTURE

## 2014-04-20 ENCOUNTER — Telehealth (HOSPITAL_COMMUNITY): Payer: Self-pay

## 2014-04-20 NOTE — Telephone Encounter (Signed)
Post ED Visit - Positive Culture Follow-up  Culture report reviewed by antimicrobial stewardship pharmacist: []  Wes Dulaney, Pharm.D., BCPS []  Heide Guile, Pharm.D., BCPS [x]  Alycia Rossetti, Pharm.D., BCPS []  [place], [place].D., BCPS, AAHIVP []  Legrand Como, Pharm.D., BCPS, AAHIVP []  Isac Sarna, Pharm.D., BCPS  Positive Urine culture, >/= 100,000 colonies -> E Coli Treated with Cephalexin, organism sensitive to the same and no further patient follow-up is required at this time.  Dortha Kern 04/20/2014, 10:03 PM

## 2017-05-20 ENCOUNTER — Emergency Department (HOSPITAL_BASED_OUTPATIENT_CLINIC_OR_DEPARTMENT_OTHER)
Admission: EM | Admit: 2017-05-20 | Discharge: 2017-05-20 | Disposition: A | Discharge status: Home / Self Care | Payer: Medicare Other | Attending: Emergency Medicine | Admitting: Emergency Medicine

## 2017-05-20 ENCOUNTER — Other Ambulatory Visit: Payer: Self-pay

## 2017-05-20 ENCOUNTER — Emergency Department (HOSPITAL_BASED_OUTPATIENT_CLINIC_OR_DEPARTMENT_OTHER): Payer: Medicare Other

## 2017-05-20 ENCOUNTER — Encounter (HOSPITAL_BASED_OUTPATIENT_CLINIC_OR_DEPARTMENT_OTHER): Payer: Self-pay | Admitting: Emergency Medicine

## 2017-05-20 DIAGNOSIS — Z79899 Other long term (current) drug therapy: Secondary | ICD-10-CM | POA: Insufficient documentation

## 2017-05-20 DIAGNOSIS — I10 Essential (primary) hypertension: Secondary | ICD-10-CM | POA: Diagnosis not present

## 2017-05-20 DIAGNOSIS — J45909 Unspecified asthma, uncomplicated: Secondary | ICD-10-CM | POA: Insufficient documentation

## 2017-05-20 DIAGNOSIS — M25552 Pain in left hip: Secondary | ICD-10-CM | POA: Diagnosis present

## 2017-05-20 MED ORDER — ACETAMINOPHEN 325 MG PO TABS
650.0000 mg | ORAL_TABLET | Freq: Once | ORAL | Status: AC
  Administered 2017-05-20: 650 mg via ORAL
  Filled 2017-05-20: qty 2

## 2017-05-20 MED ORDER — TRAMADOL HCL 50 MG PO TABS: 50.0000 mg | ORAL_TABLET | Freq: Four times a day (QID) | ORAL | 0 refills | Status: DC | PRN

## 2017-05-20 MED ORDER — METHYLPREDNISOLONE 4 MG PO TBPK: ORAL_TABLET | ORAL | 0 refills | Status: DC

## 2017-05-20 MED ORDER — METHOCARBAMOL 500 MG PO TABS
500.0000 mg | ORAL_TABLET | Freq: Once | ORAL | Status: AC
  Administered 2017-05-20: 500 mg via ORAL
  Filled 2017-05-20: qty 1

## 2017-05-20 MED ORDER — TRAMADOL HCL 50 MG PO TABS
50.0000 mg | ORAL_TABLET | Freq: Once | ORAL | Status: AC
  Administered 2017-05-20: 50 mg via ORAL
  Filled 2017-05-20: qty 1

## 2017-05-20 MED ORDER — DICLOFENAC SODIUM 1 % TD GEL: 2.0000 g | Freq: Four times a day (QID) | TRANSDERMAL | 0 refills | Status: DC | PRN

## 2017-05-20 NOTE — Discharge Instructions (Signed)
It was my pleasure taking care of you today!   Take steroid pack as directed. This will help over time with the pain and inflammation.  Prescription for the gel is to rub on the area for pain relief.  Ultram (Tramadol) is only as needed for severe pain.  You can also take Tylenol over the counter as needed for additional pain relief.   Follow up with the orthopedic doctor.   Return to ER for new or worsening symptoms, any additional concerns.

## 2017-05-20 NOTE — ED Provider Notes (Signed)
[facility]PART[facility] Provider Note   CSN: [id number] Arrival date & time: 05/20/17  1124     History   Chief Complaint Chief Complaint  Patient presents with  . Hip Pain    HPI Stacey Erickson is a 84 y.o. female.  The history is provided by the patient and medical records. No language interpreter was used.  Hip Pain  Pertinent negatives include no abdominal pain.   Stacey Erickson is a 84 y.o. female  with a PMH as listed below who presents to the Emergency Department complaining of left lateral hip pain for the last 2 days.  Pain will radiate down the side of her leg and stops couple inches above the knee.  No numbness or tingling.  She denies any known injury or trauma.  No increase in her usual activity.  She does have history of sciatica, but states this feels much different.  She has tried ibuprofen at home with no improvement.  Was able to ambulate on the leg, but this morning, her grandson had to help her walk around the house because it was too painful.  No fever or chills.  Pain worse with ambulation and certain movements.   Past Medical History:  Diagnosis Date  . Anal cancer ([place])   . Asthma   . Diverticulitis   . Hernia   . Hypertension     There are no active problems to display for this patient.   Past Surgical History:  Procedure Laterality Date  . ABDOMINAL HYSTERECTOMY    . FRACTURE SURGERY    . HERNIA REPAIR       OB History   None      Home Medications    Prior to Admission medications   Medication Sig Start Date End Date Taking? Authorizing Provider  amLODipine (NORVASC) 10 MG tablet Take 10 mg by mouth daily.    [provider]  Biotin 10 MG CAPS Take 1 tablet by mouth daily.     [provider]  bismuth subsalicylate (PEPTO BISMOL) 262 MG chewable tablet Chew 262 mg by mouth as needed. Patient used this medication for stomach pain.    [provider]  calcium carbonate (OS-CAL) 1250 MG  chewable tablet Chew 1 tablet by mouth daily. Patient used this medication for stomach pain as well.    [provider]  cephALEXin (KEFLEX) 500 MG capsule Take 1 capsule (500 mg total) by mouth 2 (two) times daily. 04/13/14   Malvin Johns, MD  cholecalciferol (VITAMIN D) 1000 UNITS tablet Take 1,000 Units by mouth daily.    [provider]  diclofenac sodium (VOLTAREN) 1 % GEL Apply 2 g topically 4 (four) times daily as needed (hip pain). 05/20/17   Ward, Ozella Almond, PA-C  methylPREDNISolone (MEDROL DOSEPAK) 4 MG TBPK tablet Take as directed on package. 05/20/17   Ward, Ozella Almond, PA-C  Multiple Vitamin (MULTIVITAMIN) capsule Take 1 capsule by mouth daily.    [provider]  omeprazole (PRILOSEC) 20 MG capsule Take 20 mg by mouth daily. Patient is using this medication for acid reflux.    [provider]  pravastatin (PRAVACHOL) 10 MG tablet Take 10 mg by mouth daily.    [provider]  traMADol (ULTRAM) 50 MG tablet Take 1 tablet (50 mg total) by mouth every 6 (six) hours as needed. 05/20/17   Ward, Ozella Almond, PA-C    Family History History reviewed. No pertinent family history.  Social History Social History  Tobacco Use  . Smoking status: Never Smoker  . Smokeless tobacco: Never Used  Substance Use Topics  . Alcohol use: No  . Drug use: No     Allergies   Patient has no known allergies.   Review of Systems Review of Systems  Constitutional: Negative for chills and fever.  Gastrointestinal: Negative for abdominal pain, nausea and vomiting.  Genitourinary: Negative for difficulty urinating.  Musculoskeletal: Positive for arthralgias and myalgias. Negative for back pain.  Skin: Negative for color change and wound.  Neurological: Negative for weakness and numbness.     Physical Exam Updated Vital Signs BP (!) 145/79 (BP Location: Right Arm)   Pulse 62   Temp 98.5 F (36.9 C) (Oral)   Resp 18   Ht 5\' 2"  (1.575 m)    Wt 60.3 kg (133 lb)   SpO2 95%   BMI 24.33 kg/m   Physical Exam  Constitutional: She is oriented to person, place, and time. She appears well-developed and well-nourished. No distress.  HENT:  Head: Normocephalic and atraumatic.  Cardiovascular: Normal rate, regular rhythm and normal heart sounds.  No murmur heard. Pulmonary/Chest: Effort normal and breath sounds normal. No respiratory distress.  Abdominal: Soft. She exhibits no distension. There is no tenderness.  Musculoskeletal:  No spinal tenderness.  Tenderness to palpation along left lateral hip.  No overlying skin changes. Full ROM and 5/5 strength of left LE.  Pain reproducible with hip external rotation.  All compartments of the left lower extremity are soft.  Sensation is intact.  2+ DP.  No tenderness to the knee or ankle.  Neurological: She is alert and oriented to person, place, and time.  Skin: Skin is warm and dry.  Nursing note and vitals reviewed.    ED Treatments / Results  Labs (all labs ordered are listed, but only abnormal results are displayed) Labs Reviewed - No data to display  EKG None  Radiology Ct Hip Left Wo Contrast  Result Date: 05/20/2017 CLINICAL DATA:  Left hip pain radiating to the knee for 2 days. No known injury. EXAM: CT OF THE LEFT HIP WITHOUT CONTRAST TECHNIQUE: Multidetector CT imaging of the left hip was performed according to the standard protocol. Multiplanar CT image reconstructions were also generated. COMPARISON:  Plain films left hip this same day. CT abdomen and pelvis 06/26/2011. FINDINGS: Bones/Joint/Cartilage No acute bony or joint abnormality is seen. Thickening of the inferior, medial cortex at the base of the left femoral neck and mild varus angulation of the left hip are most compatible with remote healed fracture. The appearance is unchanged compared to the prior CT scan. There is mild moderate joint space narrowing and some osteophytosis and chondrocalcinosis about the hip.  Ligaments Suboptimally assessed by CT. Muscles and Tendons Intact. Soft tissues Imaged intrapelvic contents show of mesh from prior hernia repair. No acute abnormality is identified. IMPRESSION: No acute abnormality. Findings consistent with remote healed fracture of the base of the left femoral neck, unchanged since 2013. Mild to moderate left hip osteoarthritis. Chondrocalcinosis left hip also noted. Electronically Signed   By: Inge Rise M.D.   On: 05/20/2017 14:06   Dg Hip Unilat W Or Wo Pelvis 2-3 Views Left  Result Date: 05/20/2017 CLINICAL DATA:  Left hip pain for 2 days.  No injury. EXAM: DG HIP (WITH OR WITHOUT PELVIS) 2-3V LEFT COMPARISON:  CT abdomen pelvis June 26, 2011. FINDINGS: There is no evidence of hip fracture or dislocation. There is focal increased sclerosis in the medial  aspect of the bilateral femoral neck/lesser trochanteric region not significantly changed compared to prior CT of the pelvis of 2013. Increased symmetrical sclerosis of bilateral ileum about the sacroiliac joint are noted. IMPRESSION: No acute fracture or dislocation. Chronic increased sclerosis of medial aspect of bilateral femoral neck/lesser trochanteric region not changed compared to prior CT of 2013. Electronically Signed   By: Abelardo Diesel M.D.   On: 05/20/2017 12:38    Procedures Procedures (including critical care time)  Medications Ordered in ED Medications  acetaminophen (TYLENOL) tablet 650 mg (650 mg Oral Given 05/20/17 1242)  traMADol (ULTRAM) tablet 50 mg (50 mg Oral Given 05/20/17 1243)  methocarbamol (ROBAXIN) tablet 500 mg (500 mg Oral Given 05/20/17 1242)     Initial Impression / Assessment and Plan / ED Course  I have reviewed the triage vital signs and the nursing notes.  Pertinent labs & imaging results that were available during my care of the patient were reviewed by me and considered in my medical decision making (see chart for details).     Stacey Erickson is a 84 y.o. female  who presents to ED for left hip pain over the last 2 days. No known injury or inciting event. NVI on exam. She does tenderness to palpation of the left hip. No erythema or swelling. She is afebrile. Exam not c/w septic joint. X-ray with no acute findings. Attempted ambulation, but had great difficulty doing this. Given medications for pain. CT obtained also showing no acute findings. Does show arthritis and chondrocalcinosis of the left hip. Able to ambulate better after symptom control. Will have her follow up with orthopedics. Given rx for medrol dose pack, voltaren gel and short course of ultram. Return precautions discussed with patient and family. All questions answered.   Patient seen by and discussed with Dr. Maryan Rued who agrees with treatment plan.    Final Clinical Impressions(s) / ED Diagnoses   Final diagnoses:  Left hip pain    ED Discharge Orders        Ordered    methylPREDNISolone (MEDROL DOSEPAK) 4 MG TBPK tablet     05/20/17 1450    diclofenac sodium (VOLTAREN) 1 % GEL  4 times daily PRN     05/20/17 1450    traMADol (ULTRAM) 50 MG tablet  Every 6 hours PRN     05/20/17 1451       Ward, Ozella Almond, PA-C 05/20/17 1558    Blanchie Dessert, MD 05/21/17 [phone]

## 2017-05-20 NOTE — ED Notes (Signed)
Patient transported to X-ray 

## 2017-05-20 NOTE — ED Notes (Signed)
Patient transported to CT 

## 2017-05-20 NOTE — ED Triage Notes (Signed)
Patient states that she is having left hip pain that radiates down her left leg to about the knee region. Denies any numbness or tingling  - denies any injury

## 2017-05-30 DIAGNOSIS — S72002A Fracture of unspecified part of neck of left femur, initial encounter for closed fracture: Secondary | ICD-10-CM | POA: Diagnosis not present

## 2017-05-30 DIAGNOSIS — I10 Essential (primary) hypertension: Secondary | ICD-10-CM

## 2017-05-30 DIAGNOSIS — E039 Hypothyroidism, unspecified: Secondary | ICD-10-CM | POA: Diagnosis not present

## 2017-05-30 DIAGNOSIS — E785 Hyperlipidemia, unspecified: Secondary | ICD-10-CM

## 2017-05-31 DIAGNOSIS — E860 Dehydration: Secondary | ICD-10-CM

## 2017-05-31 DIAGNOSIS — E039 Hypothyroidism, unspecified: Secondary | ICD-10-CM | POA: Diagnosis not present

## 2017-05-31 DIAGNOSIS — I10 Essential (primary) hypertension: Secondary | ICD-10-CM | POA: Diagnosis not present

## 2017-05-31 DIAGNOSIS — E785 Hyperlipidemia, unspecified: Secondary | ICD-10-CM | POA: Diagnosis not present

## 2017-05-31 DIAGNOSIS — E871 Hypo-osmolality and hyponatremia: Secondary | ICD-10-CM

## 2017-05-31 DIAGNOSIS — S72002A Fracture of unspecified part of neck of left femur, initial encounter for closed fracture: Secondary | ICD-10-CM | POA: Diagnosis not present

## 2017-06-01 DIAGNOSIS — E871 Hypo-osmolality and hyponatremia: Secondary | ICD-10-CM | POA: Diagnosis not present

## 2017-06-01 DIAGNOSIS — E785 Hyperlipidemia, unspecified: Secondary | ICD-10-CM | POA: Diagnosis not present

## 2017-06-01 DIAGNOSIS — E860 Dehydration: Secondary | ICD-10-CM | POA: Diagnosis not present

## 2017-06-01 DIAGNOSIS — S72002A Fracture of unspecified part of neck of left femur, initial encounter for closed fracture: Secondary | ICD-10-CM | POA: Diagnosis not present

## 2017-06-01 DIAGNOSIS — I10 Essential (primary) hypertension: Secondary | ICD-10-CM | POA: Diagnosis not present

## 2017-06-02 DIAGNOSIS — I10 Essential (primary) hypertension: Secondary | ICD-10-CM | POA: Diagnosis not present

## 2017-06-02 DIAGNOSIS — E039 Hypothyroidism, unspecified: Secondary | ICD-10-CM | POA: Diagnosis not present

## 2017-06-02 DIAGNOSIS — S72002A Fracture of unspecified part of neck of left femur, initial encounter for closed fracture: Secondary | ICD-10-CM | POA: Diagnosis not present

## 2017-06-02 DIAGNOSIS — E871 Hypo-osmolality and hyponatremia: Secondary | ICD-10-CM | POA: Diagnosis not present

## 2019-05-13 DIAGNOSIS — R05 Cough: Secondary | ICD-10-CM | POA: Diagnosis not present

## 2019-05-13 DIAGNOSIS — M810 Age-related osteoporosis without current pathological fracture: Secondary | ICD-10-CM | POA: Diagnosis not present

## 2019-06-09 ENCOUNTER — Encounter (HOSPITAL_BASED_OUTPATIENT_CLINIC_OR_DEPARTMENT_OTHER): Payer: Self-pay | Admitting: *Deleted

## 2019-06-09 ENCOUNTER — Emergency Department (HOSPITAL_BASED_OUTPATIENT_CLINIC_OR_DEPARTMENT_OTHER)
Admission: EM | Admit: 2019-06-09 | Discharge: 2019-06-09 | Disposition: A | Discharge status: Home / Self Care | Payer: Medicare PPO | Attending: Emergency Medicine | Admitting: Emergency Medicine

## 2019-06-09 ENCOUNTER — Emergency Department (HOSPITAL_BASED_OUTPATIENT_CLINIC_OR_DEPARTMENT_OTHER): Payer: Medicare PPO

## 2019-06-09 ENCOUNTER — Other Ambulatory Visit: Payer: Self-pay

## 2019-06-09 DIAGNOSIS — W010XXA Fall on same level from slipping, tripping and stumbling without subsequent striking against object, initial encounter: Secondary | ICD-10-CM | POA: Insufficient documentation

## 2019-06-09 DIAGNOSIS — Y9389 Activity, other specified: Secondary | ICD-10-CM | POA: Diagnosis not present

## 2019-06-09 DIAGNOSIS — I10 Essential (primary) hypertension: Secondary | ICD-10-CM | POA: Diagnosis not present

## 2019-06-09 DIAGNOSIS — S32039A Unspecified fracture of third lumbar vertebra, initial encounter for closed fracture: Secondary | ICD-10-CM | POA: Insufficient documentation

## 2019-06-09 DIAGNOSIS — Y998 Other external cause status: Secondary | ICD-10-CM | POA: Insufficient documentation

## 2019-06-09 DIAGNOSIS — J45909 Unspecified asthma, uncomplicated: Secondary | ICD-10-CM | POA: Diagnosis not present

## 2019-06-09 DIAGNOSIS — Z85048 Personal history of other malignant neoplasm of rectum, rectosigmoid junction, and anus: Secondary | ICD-10-CM | POA: Diagnosis not present

## 2019-06-09 DIAGNOSIS — Z79899 Other long term (current) drug therapy: Secondary | ICD-10-CM | POA: Insufficient documentation

## 2019-06-09 DIAGNOSIS — Y92012 Bathroom of single-family (private) house as the place of occurrence of the external cause: Secondary | ICD-10-CM | POA: Insufficient documentation

## 2019-06-09 DIAGNOSIS — S3992XA Unspecified injury of lower back, initial encounter: Secondary | ICD-10-CM | POA: Diagnosis present

## 2019-06-09 MED ORDER — LIDOCAINE 5 % EX PTCH: 1.0000 | MEDICATED_PATCH | CUTANEOUS | 0 refills | Status: AC

## 2019-06-09 NOTE — Discharge Instructions (Signed)
[place] el parche de lidocaine si necesita por dolor.  Toma tylneol si necesita por dolor.  Da Stacey Erickson cita con el doctor primario si necesita mas medicina por dolor Regresa a la sala de emergencia si el dolor es empeorando, si no puede caminar, si esta urinando sin intentar, o con nuevas sintomas.

## 2019-06-09 NOTE — ED Provider Notes (Signed)
[facility] EMERGENCY DEPARTMENT Provider Note   CSN: [id number] Arrival date & time: 06/09/19  1313     History Chief Complaint  Patient presents with  . Fall  . Back Pain    Stacey Erickson is a 86 y.o. female presenting for evaluation of back pain.  Patient states 3 days ago she was getting off the toilet when her legs slipped, and she jerked and sharply twisted her low back.  Since then, she has had intermittent low back pain.  Pain is present when she coughs or when she bends forward.  No pain at rest or with any other movements.  She has no pain with walking.  She denies numbness or tingling.  She do not fall or hit her head.  She took Tylenol yesterday with improvement of her pain, she has not taken anything else.  She denies numbness or tingling.  She denies loss of bowel bladder control.  No pain in the upper part of her back.  She reports a history of osteoporosis of the pelvis/hips, is s/p L hip replacement.   HPI     Past Medical History:  Diagnosis Date  . 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 Asthma   . Diverticulitis   . Hernia   . Hypertension     There are no problems to display for this patient.   Past Surgical History:  Procedure Laterality Date  . ABDOMINAL HYSTERECTOMY    . FRACTURE SURGERY    . HERNIA REPAIR       OB History   No obstetric history on file.     No family history on file.  Social History   Tobacco Use  . Smoking status: Never Smoker  . Smokeless tobacco: Never Used  Substance Use Topics  . Alcohol use: No  . Drug use: No    Home Medications Prior to Admission medications   Medication Sig Start Date End Date Taking? Authorizing Provider  albuterol (VENTOLIN HFA) 108 (90 Base) MCG/ACT inhaler Inhale into the lungs every 4 (four) hours as needed for wheezing or shortness of breath.   Yes [provider]  alendronate (FOSAMAX) 70 MG tablet Take 70 mg by mouth once a week. Take with a full glass of water on an empty  stomach.   Yes [provider]  Biotin 10 MG CAPS Take 1 tablet by mouth daily.    Yes [provider]  calcium carbonate (OS-CAL) 1250 MG chewable tablet Chew 1 tablet by mouth daily. Patient used this medication for stomach pain as well.   Yes [provider]  cetirizine (ZYRTEC) 10 MG tablet Take 10 mg by mouth daily as needed for allergies.   Yes [provider]  cholecalciferol (VITAMIN D) 1000 UNITS tablet Take 1,000 Units by mouth daily.   Yes [provider]  levothyroxine (SYNTHROID) 50 MCG tablet Take 50 mcg by mouth daily before breakfast.   Yes [provider]  losartan (COZAAR) 25 MG tablet Take 25 mg by mouth daily.   Yes [provider]  pantoprazole (PROTONIX) 40 MG tablet Take 40 mg by mouth 2 (two) times daily.   Yes [provider]  pravastatin (PRAVACHOL) 10 MG tablet Take 10 mg by mouth daily.   Yes [provider]  vitamin C (ASCORBIC ACID) 250 MG tablet Take 500 mg by mouth daily.   Yes [provider]  lidocaine (LIDODERM) 5 % Place 1 patch onto the skin daily. Remove & Discard patch within  12 hours or as directed by MD 06/09/19   Taray Normoyle, PA-C  Multiple Vitamin (MULTIVITAMIN) capsule Take 1 capsule by mouth daily.    [provider]    Allergies    Patient has no known allergies.  Review of Systems   Review of Systems  Musculoskeletal: Positive for back pain.  All other systems reviewed and are negative.   Physical Exam Updated Vital Signs BP (!) 185/101   Pulse 75   Temp 98 F (36.7 C) (Oral)   Resp 16   Ht 5\' 2"  (1.575 m)   Wt 61.7 kg   SpO2 98%   BMI 24.87 kg/m   Physical Exam Vitals and nursing note reviewed.  Constitutional:      General: She is not in acute distress.    Appearance: She is well-developed.     Comments: Resting comfortably in the bed in no acute distress  HENT:     Head: Normocephalic and atraumatic.  Eyes:      Conjunctiva/sclera: Conjunctivae normal.     Pupils: Pupils are equal, round, and reactive to light.  Cardiovascular:     Rate and Rhythm: Normal rate and regular rhythm.  Pulmonary:     Effort: Pulmonary effort is normal. No respiratory distress.     Breath sounds: Normal breath sounds. No wheezing.  Abdominal:     General: There is no distension.     Palpations: Abdomen is soft. There is no mass.     Tenderness: There is no abdominal tenderness. There is no guarding or rebound.  Musculoskeletal:        General: Tenderness present. Normal range of motion.     Cervical back: Normal range of motion and neck supple.     Comments: Mild ttp of midline low back. No step offs or deformities. No ttp of upper back. Pedal pulses 2+ bilaterally. Good distal sensation and cap refill. No saddle paresthesias. strength of lower extremities equal bilaterally.   Skin:    General: Skin is warm and dry.     Capillary Refill: Capillary refill takes less than 2 seconds.  Neurological:     Mental Status: She is alert and oriented to person, place, and time.     ED Results / Procedures / Treatments   Labs (all labs ordered are listed, but only abnormal results are displayed) Labs Reviewed - No data to display  EKG None  Radiology CT Lumbar Spine Wo Contrast  Result Date: 06/09/2019 CLINICAL DATA:  Low back pain since a fall three days ago. EXAM: CT LUMBAR SPINE WITHOUT CONTRAST TECHNIQUE: Multidetector CT imaging of the lumbar spine was performed without intravenous contrast administration. Multiplanar CT image reconstructions were also generated. COMPARISON:  CT scan of the pelvis dated 06/09/2019 and CT scan of the abdomen and pelvis dated 06/26/2011 FINDINGS: Segmentation: 5 lumbar type vertebrae. Alignment: Minimal thoracolumbar scoliosis. Lateral alignment is normal. Vertebrae: There is a mild acute compression fracture of the superior endplate of L3. No appreciable protrusion of bone into the  spinal canal. No paraspinal hematoma. No other acute or bone abnormality. Paraspinal and other soft tissues: There is a new 8 mm exophytic nodule of the lateral aspect of the upper pole of the right kidney, indeterminate. Aortic atherosclerosis. Scattered diverticula in the colon. Disc levels: T11-12: Negative. T12-L1: Negative. L1-2: Negative. L2-3: Mild acute compression fracture of the superior endplate of L3. No protrusion of bone into the spinal canal. Small disc bulge into the right lateral recess and right neural  foramen. L3-4: Small broad-based disc bulge with no neural impingement. L4-5: Broad-based disc bulge with disc degeneration, disc space narrowing, and a vacuum phenomenon. Moderate left and mild right facet arthritis. Mild narrowing of the spinal canal. L5-S1: Severe right and moderately severe left facet arthritis. No disc bulging or protrusion. IMPRESSION: 1. Mild acute compression fracture of the superior endplate of L3. No appreciable protrusion of bone into the spinal canal. 2. Multilevel degenerative disc and joint disease. 3. New 8 mm exophytic nodule of the lateral aspect of the upper pole of the right kidney, indeterminate. Renal ultrasound on an elective basis is recommended for further characterization. 4. Aortic atherosclerosis. Aortic Atherosclerosis (ICD10-I70.0). Electronically Signed   By: Lorriane Shire M.D.   On: 06/09/2019 14:22   CT PELVIS WO CONTRAST  Result Date: 06/09/2019 CLINICAL DATA:  Fall 3 days ago. Severe pelvic and sacrococcygeal pain. Initial encounter. EXAM: CT PELVIS WITHOUT CONTRAST TECHNIQUE: Multidetector CT imaging of the pelvis was performed following the standard protocol without intravenous contrast. COMPARISON:  Left hip CT on 05/20/2017 FINDINGS: Lower Urinary Tract: Bladder and inferior pelvis is partially obscured by beam hardening artifact from left hip prosthesis. No definite abnormality seen. Bowel: No evidence of dilated bowel loops. Surgical mesh  seen in the lower anterior abdominal wall, without evidence of recurrent hernia at this site. A small midline ventral hernia is seen above the umbilicus which contains only omental fat. Sigmoid diverticulosis is seen, without evidence of diverticulitis in this region. Vascular/Lymphatic: No pathologically enlarged lymph nodes or other significant abnormality. Reproductive: Prior hysterectomy noted. Adnexal regions are unremarkable in appearance. Other: None. Musculoskeletal: No evidence of acute fracture involving sacrum, coccyx or remainder of the pelvis. Left hip prosthesis is seen in appropriate position. Moderate right hip osteoarthritis is also noted. IMPRESSION: 1. No evidence of pelvic fracture or other acute findings. 2. Small midline ventral hernia above the umbilicus, which contains only omental fat. 3. Sigmoid diverticulosis, without radiographic evidence of diverticulitis. Electronically Signed   By: Marlaine Hind M.D.   On: 06/09/2019 14:19    Procedures Procedures (including critical care time)  Medications Ordered in ED Medications - No data to display  ED Course  I have reviewed the triage vital signs and the nursing notes.  Pertinent labs & imaging results that were available during my care of the patient were reviewed by me and considered in my medical decision making (see chart for details).    MDM Rules/Calculators/A&P                      Patient presented for evaluation of low back pain.  On exam, patient appears nontoxic.  She is neurovascularly intact.  She does have some mild midline lower back tenderness palpation.  Considering her age and her history of osteoporosis, will obtain CT of the lumbar spine and pelvis to ensure no fracture dislocation.  CT lumbar spine shows endplate fracture of L3 without compression of the spinal cord.  No further fractures noted.  This patient is neurovascularly intact with a stable fracture, I do not think she needs neurosurgical  consultation or intervention at this time.  Case discussed with attending, Dr. Sherry Ruffing evaluated the patient.  Discussed findings with patient and son.  Discussed symptomatic treatment with Tylenol and lidocaine patches.  Encourage follow-up with primary care as needed for further evaluation.  At this time, patient present for discharge.  Return precautions given.  Patient and son state they understand and agree to plan.  Final Clinical Impression(s) / ED Diagnoses Final diagnoses:  Closed fracture of third lumbar vertebra, unspecified fracture morphology, initial encounter [facility])    Rx / DC Orders ED Discharge Orders         Ordered    lidocaine (LIDODERM) 5 %  Every 24 hours     06/09/19 [place], Assad Harbeson, PA-C 06/09/19 1506    Teg[place], Gwenyth Allegra, MD 06/10/19 1141

## 2019-06-09 NOTE — ED Triage Notes (Signed)
3 days ago she fell onto the toilet. Injury to her lower back/coccyx.

## 2021-02-22 IMAGING — CT CT PELVIS W/O CM
3 series · 13 of 33 positions shown, 16 images · non-contrast
Comparison: Left hip CT on 05/20/2017

CLINICAL DATA: Fall 3 days ago. Severe pelvic and sacrococcygeal
pain. Initial encounter.

EXAM:
CT PELVIS WITHOUT CONTRAST
TECHNIQUE: Multidetector CT imaging of the pelvis was performed following the
standard protocol without intravenous contrast.

[Series 6: coronal st · coronal · 0.64mm/px · 3 of 120 slices shown]
[im 24/120  bone]
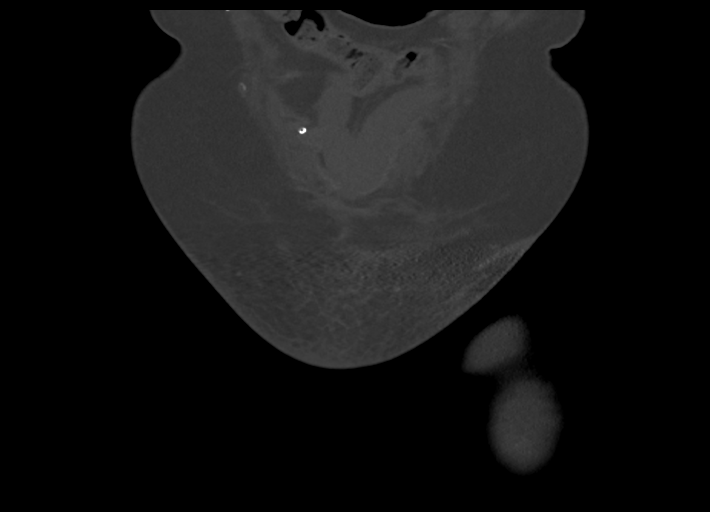
[im 48/120  bone]
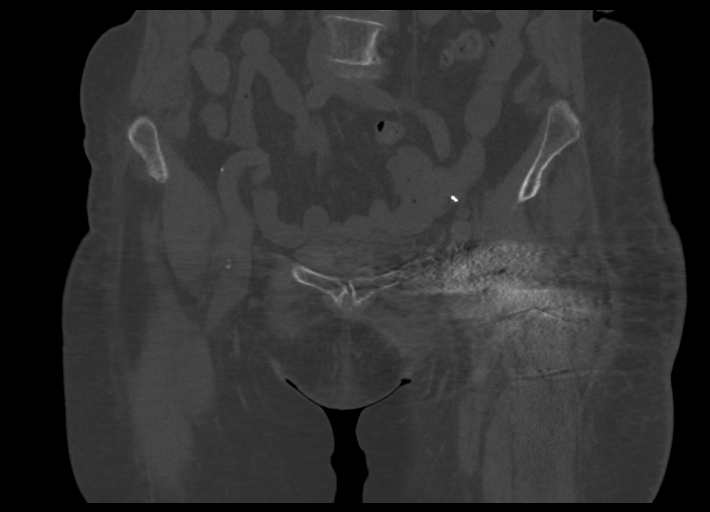
[im 72/120  bone]
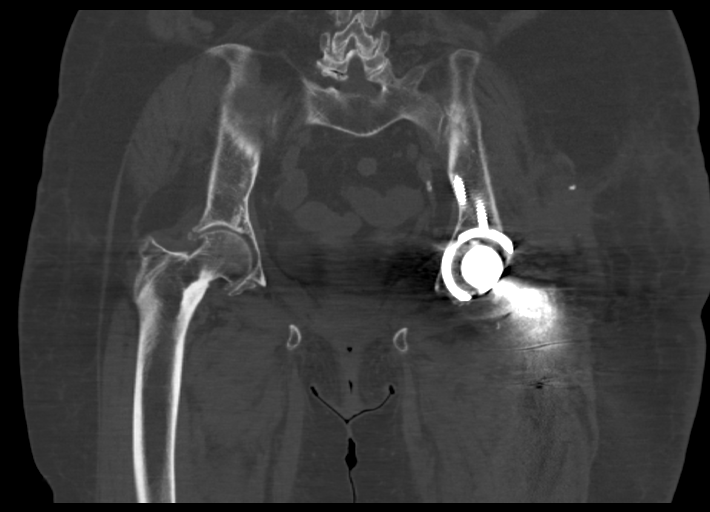

[Series 7: sagittal st · sagittal · 0.49mm/px · 5 of 156 slices shown, 6 images]
[im 52/156  bone]
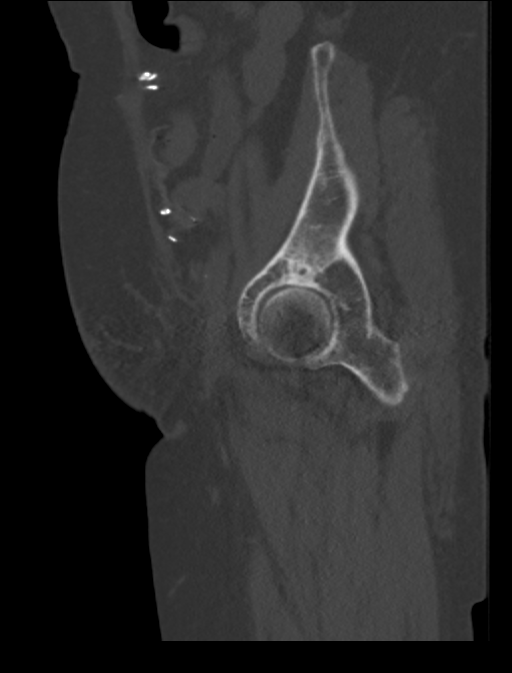
[im 65/156  bone]
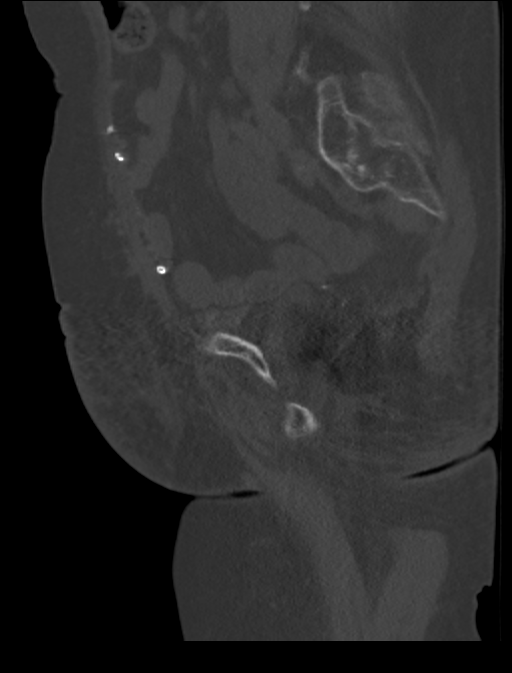
[im 78/156  soft-tissue]
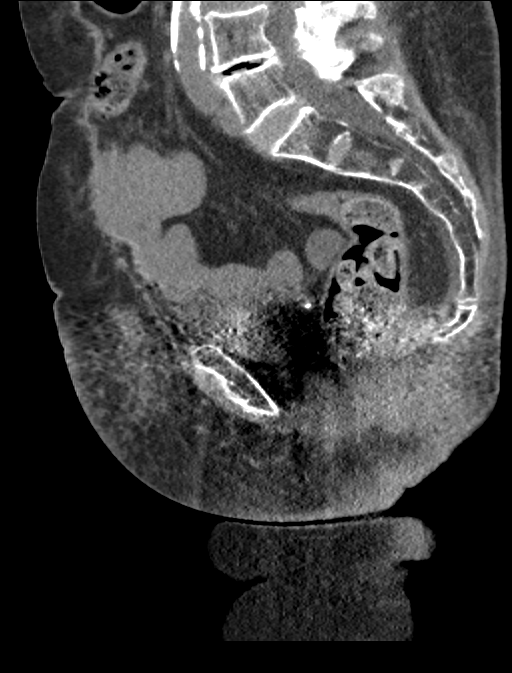
[im 78/156  bone]
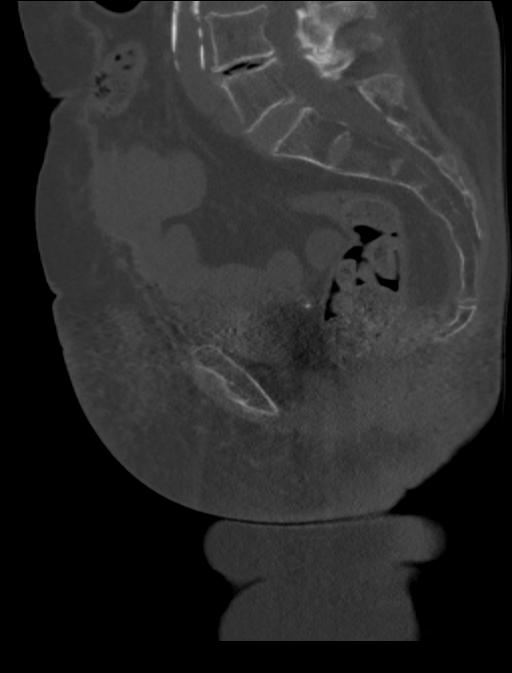
[im 91/156  bone]
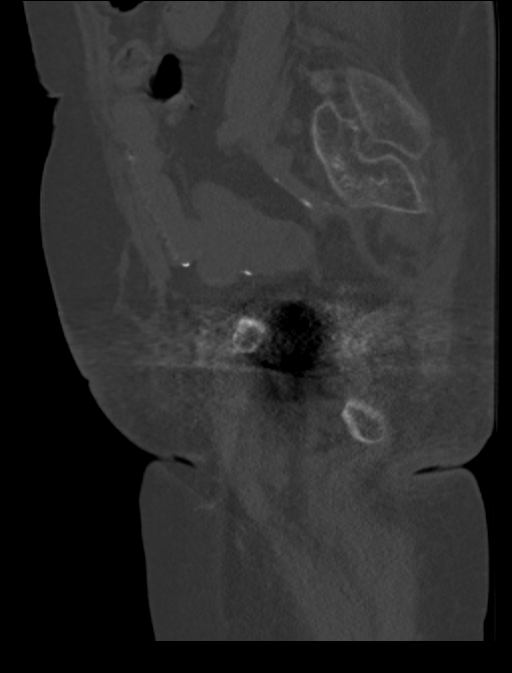
[im 104/156  bone]
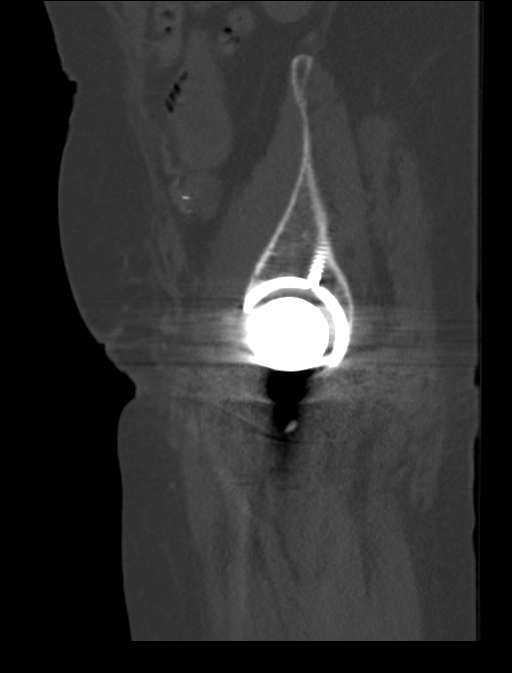

[Series 10: axial soft tissue · axial · 0.85mm/px · z∈[-404,-188]mm · 5 of 157 slices shown, 7 images]
[im 25/157  soft-tissue]
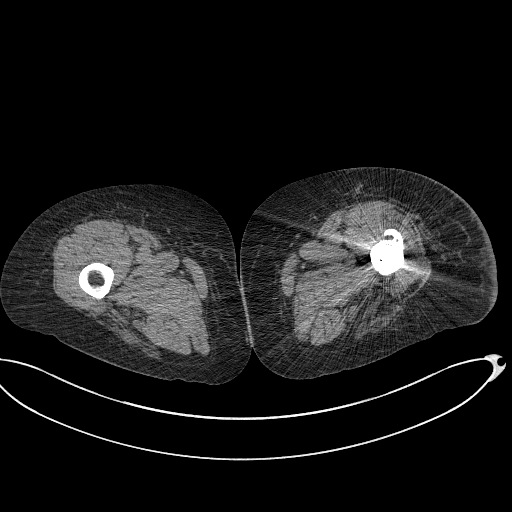
[im 25/157  bone]
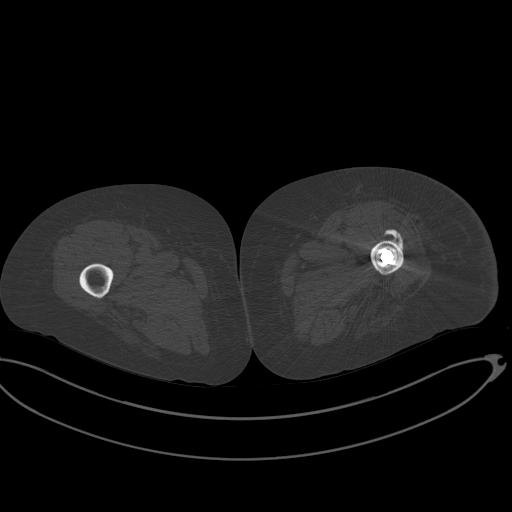
[im 49/157  bone]
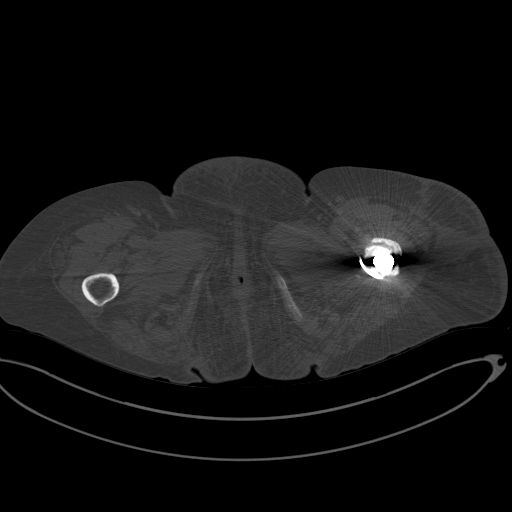
[im 85/157  bone]
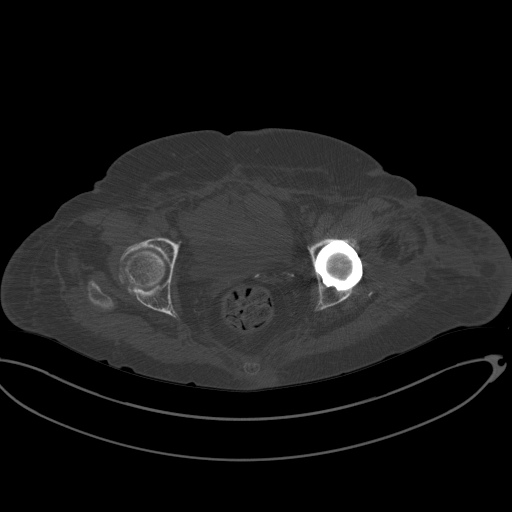
[im 109/157  bone]
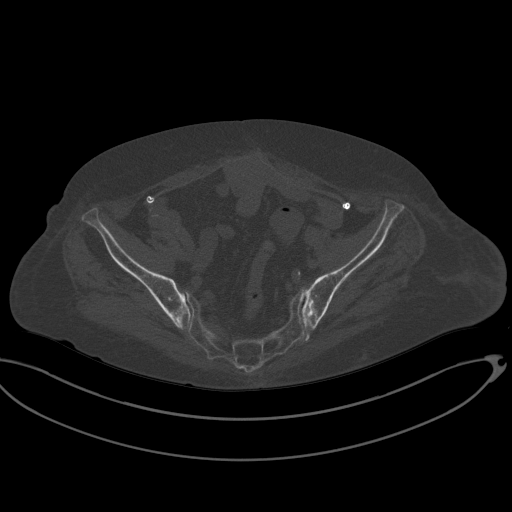
[im 133/157  soft-tissue]
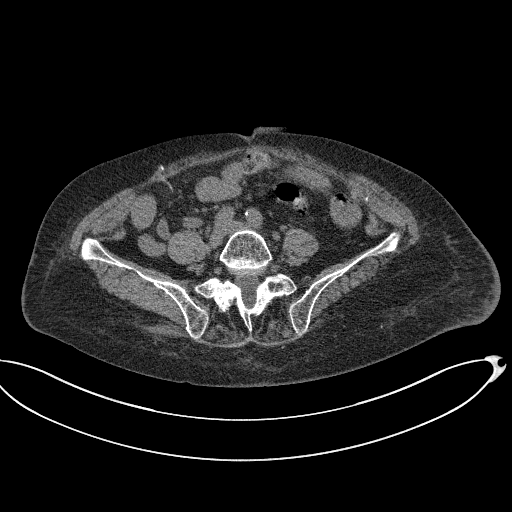
[im 133/157  bone]
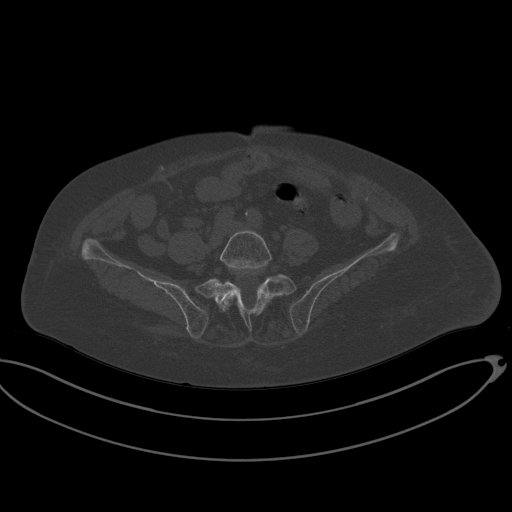

[13 of 33 positions shown; findings below may reference images not displayed]

FINDINGS: Lower Urinary Tract: Bladder and inferior pelvis is partially
obscured by beam hardening artifact from left hip prosthesis. No
definite abnormality seen.

Bowel: No evidence of dilated bowel loops. Surgical mesh seen in the
lower anterior abdominal wall, without evidence of recurrent hernia
at this site. A small midline ventral hernia is seen above the
umbilicus which contains only omental fat. Sigmoid diverticulosis is
seen, without evidence of diverticulitis in this region.

Vascular/Lymphatic: No pathologically enlarged lymph nodes or other
significant abnormality.

Reproductive: Prior hysterectomy noted. Adnexal regions are
unremarkable in appearance.

Other: None.

Musculoskeletal: No evidence of acute fracture involving sacrum,
coccyx or remainder of the pelvis. Left hip prosthesis is seen in
appropriate position. Moderate right hip osteoarthritis is also
noted.
IMPRESSION: 1. No evidence of pelvic fracture or other acute findings.
2. Small midline ventral hernia above the umbilicus, which contains
only omental fat.
3. Sigmoid diverticulosis, without radiographic evidence of
diverticulitis.

## 2021-07-20 ENCOUNTER — Other Ambulatory Visit: Payer: Self-pay

## 2021-07-20 ENCOUNTER — Emergency Department (HOSPITAL_BASED_OUTPATIENT_CLINIC_OR_DEPARTMENT_OTHER)
Admission: EM | Admit: 2021-07-20 | Discharge: 2021-07-20 | Disposition: A | Discharge status: Home / Self Care | Payer: Medicare PPO | Attending: Emergency Medicine | Admitting: Emergency Medicine

## 2021-07-20 ENCOUNTER — Encounter (HOSPITAL_BASED_OUTPATIENT_CLINIC_OR_DEPARTMENT_OTHER): Payer: Self-pay | Admitting: Emergency Medicine

## 2021-07-20 DIAGNOSIS — I10 Essential (primary) hypertension: Secondary | ICD-10-CM | POA: Insufficient documentation

## 2021-07-20 DIAGNOSIS — R21 Rash and other nonspecific skin eruption: Secondary | ICD-10-CM | POA: Insufficient documentation

## 2021-07-20 DIAGNOSIS — L989 Disorder of the skin and subcutaneous tissue, unspecified: Secondary | ICD-10-CM

## 2021-07-20 LAB — CBC WITH DIFFERENTIAL/PLATELET
Abs Immature Granulocytes: 0.01 10*3/uL (ref 0.00–0.07)
Basophils Absolute: 0 10*3/uL (ref 0.0–0.1)
Basophils Relative: 1 %
Eosinophils Absolute: 0.1 10*3/uL (ref 0.0–0.5)
Eosinophils Relative: 2 %
HCT: 37.4 % (ref 36.0–46.0)
Hemoglobin: 12.6 g/dL (ref 12.0–15.0)
Immature Granulocytes: 0 %
Lymphocytes Relative: 20 %
Lymphs Abs: 1.1 10*3/uL (ref 0.7–4.0)
MCH: 31.3 pg (ref 26.0–34.0)
MCHC: 33.7 g/dL (ref 30.0–36.0)
MCV: 92.8 fL (ref 80.0–100.0)
Monocytes Absolute: 0.5 10*3/uL (ref 0.1–1.0)
Monocytes Relative: 10 %
Neutro Abs: 3.6 10*3/uL (ref 1.7–7.7)
Neutrophils Relative %: 67 %
Platelets: 178 10*3/uL (ref 150–400)
RBC: 4.03 MIL/uL (ref 3.87–5.11)
RDW: 13.2 % (ref 11.5–15.5)
WBC: 5.4 10*3/uL (ref 4.0–10.5)
nRBC: 0 % (ref 0.0–0.2)

## 2021-07-20 LAB — BASIC METABOLIC PANEL
Anion gap: 6 (ref 5–15)
BUN: 27 mg/dL — ABNORMAL HIGH (ref 8–23)
CO2: 26 mmol/L (ref 22–32)
Calcium: 9.8 mg/dL (ref 8.9–10.3)
Chloride: 102 mmol/L (ref 98–111)
Creatinine, Ser: 0.88 mg/dL (ref 0.44–1.00)
GFR, Estimated: >60 mL/min (ref >60)
Glucose, Bld: 109 mg/dL — ABNORMAL HIGH (ref 70–99)
Potassium: 4.2 mmol/L (ref 3.5–5.1)
Sodium: 134 mmol/L — ABNORMAL LOW (ref 135–145)

## 2021-07-20 MED ORDER — AMLODIPINE BESYLATE 5 MG PO TABS
5.0000 mg | ORAL_TABLET | Freq: Once | ORAL | Status: AC
  Administered 2021-07-20: 5 mg via ORAL
  Filled 2021-07-20: qty 1

## 2021-07-20 MED ORDER — HYDRALAZINE HCL 25 MG PO TABS: 25.0000 mg | ORAL_TABLET | Freq: Three times a day (TID) | ORAL | 0 refills | Status: AC

## 2021-07-20 MED ORDER — CEPHALEXIN 500 MG PO CAPS: 500.0000 mg | ORAL_CAPSULE | Freq: Three times a day (TID) | ORAL | 0 refills | Status: AC

## 2021-07-20 MED ORDER — ACETAMINOPHEN 325 MG PO TABS
650.0000 mg | ORAL_TABLET | Freq: Once | ORAL | Status: AC
  Administered 2021-07-20: 650 mg via ORAL
  Filled 2021-07-20: qty 2

## 2021-07-20 MED ORDER — HYDRALAZINE HCL 25 MG PO TABS
25.0000 mg | ORAL_TABLET | Freq: Once | ORAL | Status: AC
  Administered 2021-07-20: 25 mg via ORAL
  Filled 2021-07-20: qty 1

## 2021-07-20 NOTE — ED Provider Notes (Addendum)
[facility] EMERGENCY DEPARTMENT Provider Note   CSN: [id number] Arrival date & time: 07/20/21  2019     History  Chief Complaint  Patient presents with   Rash    Stacey Erickson is a 88 y.o. female.  Patient presents ER chief complaint of a itchy rash in the anterior portion of her neck has been there for about 2 days now.  She states that it is initially itchy and then has a burning sensation as well.  Denies any significant pain.  No fever no vomiting no cough no diarrhea.  She has noticed her blood pressure was elevated at home.  She has a history of cellulitis requiring admission in the left lower extremity years ago and is very concerned about recurrence of cellulitis at this time in her neck.       Home Medications Prior to Admission medications   Medication Sig Start Date End Date Taking? Authorizing Provider  cephALEXin (KEFLEX) 500 MG capsule Take 1 capsule (500 mg total) by mouth 3 (three) times daily for 7 days. 07/20/21 07/27/21 Yes Luna Fuse, MD  hydrALAZINE (APRESOLINE) 25 MG tablet Take 1 tablet (25 mg total) by mouth 3 (three) times daily for 15 days. 07/20/21 08/04/21 Yes Luna Fuse, MD  albuterol (VENTOLIN HFA) 108 (90 Base) MCG/ACT inhaler Inhale into the lungs every 4 (four) hours as needed for wheezing or shortness of breath.    [provider]  alendronate (FOSAMAX) 70 MG tablet Take 70 mg by mouth once a week. Take with a full glass of water on an empty stomach.    [provider]  Biotin 10 MG CAPS Take 1 tablet by mouth daily.     [provider]  calcium carbonate (OS-CAL) 1250 MG chewable tablet Chew 1 tablet by mouth daily. Patient used this medication for stomach pain as well.    [provider]  cetirizine (ZYRTEC) 10 MG tablet Take 10 mg by mouth daily as needed for allergies.    [provider]  cholecalciferol (VITAMIN D) 1000 UNITS tablet Take 1,000 Units by mouth daily.    [provider]  levothyroxine (SYNTHROID) 50 MCG tablet Take 50 mcg by mouth daily before breakfast.    [provider]  lidocaine (LIDODERM) 5 % Place 1 patch onto the skin daily. Remove & Discard patch within 12 hours or as directed by MD 06/09/19   [place], Sophia, PA-C  losartan (COZAAR) 25 MG tablet Take 25 mg by mouth daily.    [provider]  Multiple Vitamin (MULTIVITAMIN) capsule Take 1 capsule by mouth daily.    [provider]  pantoprazole (PROTONIX) 40 MG tablet Take 40 mg by mouth 2 (two) times daily.    [provider]  pravastatin (PRAVACHOL) 10 MG tablet Take 10 mg by mouth daily.    [provider]  vitamin C (ASCORBIC ACID) 250 MG tablet Take 500 mg by mouth daily.    [provider]      Allergies    Patient has no known allergies.    Review of Systems   Review of Systems  Constitutional:  Negative for fever.  HENT:  Negative for ear pain.   Eyes:  Negative for pain.  Respiratory:  Negative for cough.   Cardiovascular:  Negative for chest pain.  Gastrointestinal:  Negative for abdominal pain.  Genitourinary:  Negative for flank pain.  Musculoskeletal:  Negative for back pain.  Skin:  Positive for rash.  Neurological:  Negative for headaches.    Physical Exam Updated Vital Signs BP (!) 190/70 (BP Location: Right Arm)   Pulse 65   Temp 97.6 F (36.4 C) (Oral)   Resp 18   SpO2 96%  Physical Exam Constitutional:      General: She is not in acute distress.    Appearance: Normal appearance.  HENT:     Head: Normocephalic.     Nose: Nose normal.  Eyes:     Extraocular Movements: Extraocular movements intact.  Cardiovascular:     Rate and Rhythm: Normal rate.  Pulmonary:     Effort: Pulmonary effort is normal.  Musculoskeletal:        General: Normal range of motion.     Cervical back: Normal range of motion.  Skin:    Comments: 6 x 6 cm patch of erythema on the anterior neck along the skin folds.   No abnormal warmth to touch no tenderness to touch no ulcerative lesions or vesicles noted.  Neurological:     General: No focal deficit present.     Mental Status: She is alert. Mental status is at baseline.     ED Results / Procedures / Treatments   Labs (all labs ordered are listed, but only abnormal results are displayed) Labs Reviewed  BASIC METABOLIC PANEL - Abnormal; Notable for the following components:      Result Value   Sodium 134 (*)    Glucose, Bld 109 (*)    BUN 27 (*)    All other components within normal limits  CBC WITH DIFFERENTIAL/PLATELET    EKG None  Radiology No results found.  Procedures Procedures    Medications Ordered in ED Medications  hydrALAZINE (APRESOLINE) tablet 25 mg (has no administration in time range)  acetaminophen (TYLENOL) tablet 650 mg (650 mg Oral Given 07/20/21 2223)  amLODipine (NORVASC) tablet 5 mg (5 mg Oral Given 07/20/21 2223)    ED Course/ Medical Decision Making/ A&P                           Medical Decision Making Amount and/or Complexity of Data Reviewed Labs: ordered.  Risk OTC drugs. Prescription drug management.   Review of record shows office visit June 20, 2021 for osteoporosis.  History from family bedside.  Cardiac monitoring showing sinus rhythm.  Sinus studies were sent.  White count appears normal chemistry unremarkable.  Clinically this appears more consistent with dermatitis or other skin irritants.  However given history of severe cellulitis in the past we will treat with antibiotics.  Recommending immediate return if she has fevers or worsening symptoms otherwise follow-up with primary care doctor within the week.  Patient blood pressure noted to be significantly elevated greater than 643P systolic, given medications here in the ER.    Final Clinical Impression(s) / ED Diagnoses Final diagnoses:  Skin lesion of neck  Hypertension, unspecified type    Rx / DC Orders ED Discharge Orders           Ordered    cephALEXin (KEFLEX) 500 MG capsule  3 times daily        07/20/21 2322    hydrALAZINE (APRESOLINE) 25 MG tablet  3 times daily        07/20/21 2322              Luna Fuse, MD 07/20/21 2252    Luna Fuse, MD 07/20/21 2322

## 2021-07-20 NOTE — ED Triage Notes (Signed)
Pt noticed itchy red rash on anterior neck and left anterior shoulder last night. Endorses "burning" sensation. Denies pain. Denies chemical exposure, difficulty breathing, throat swelling.

## 2021-07-20 NOTE — Discharge Instructions (Addendum)
Take the antibiotics as written.  Your blood pressure was very high today.  Follow-up with your primary care doctor this week to help regulate your blood pressure.  I have added additional blood pressure medications today.  Check your blood pressures at home, if they are greater than 222 systolic, take the medication as written.  If your rash worsens or if you have fevers or chills or any additional concerns, return immediately back to the ER.

## 2022-02-20 ENCOUNTER — Other Ambulatory Visit: Payer: Self-pay | Admitting: Family Medicine

## 2022-02-20 DIAGNOSIS — B028 Zoster with other complications: Secondary | ICD-10-CM

## 2022-02-20 DIAGNOSIS — R519 Headache, unspecified: Secondary | ICD-10-CM

## 2022-03-09 ENCOUNTER — Other Ambulatory Visit: Payer: Medicare PPO

## 2022-03-23 ENCOUNTER — Inpatient Hospital Stay: Admission: RE | Admit: 2022-03-23 | Payer: Medicare PPO | Source: Ambulatory Visit

## 2023-10-12 ENCOUNTER — Other Ambulatory Visit: Payer: Self-pay | Admitting: Internal Medicine

## 2023-10-12 DIAGNOSIS — R413 Other amnesia: Secondary | ICD-10-CM

## 2023-10-28 ENCOUNTER — Ambulatory Visit
Admission: RE | Admit: 2023-10-28 | Discharge: 2023-10-28 | Disposition: A | Source: Ambulatory Visit | Attending: Internal Medicine | Admitting: Internal Medicine

## 2023-10-28 DIAGNOSIS — R413 Other amnesia: Secondary | ICD-10-CM
